# Patient Record
Sex: Male | Born: 1970 | Race: Black or African American | Hispanic: No | Marital: Single | Smoking: Never smoker
Health system: Southern US, Community
[De-identification: ages and names within clinical notes are randomized; demographics above are authoritative.]

## PROBLEM LIST (undated history)

## (undated) DIAGNOSIS — K4090 Unilateral inguinal hernia, without obstruction or gangrene, not specified as recurrent: Secondary | ICD-10-CM

## (undated) DIAGNOSIS — E669 Obesity, unspecified: Secondary | ICD-10-CM

---

## 1999-01-30 ENCOUNTER — Encounter: Payer: Self-pay | Admitting: Emergency Medicine

## 1999-01-30 ENCOUNTER — Emergency Department (HOSPITAL_COMMUNITY): Admission: EM | Admit: 1999-01-30 | Discharge: 1999-01-30 | Payer: Self-pay | Admitting: Emergency Medicine

## 2001-12-23 ENCOUNTER — Emergency Department (HOSPITAL_COMMUNITY): Admission: EM | Admit: 2001-12-23 | Discharge: 2001-12-23 | Payer: Self-pay | Admitting: *Deleted

## 2009-12-10 ENCOUNTER — Emergency Department (HOSPITAL_COMMUNITY): Admission: EM | Admit: 2009-12-10 | Discharge: 2009-12-11 | Payer: Self-pay | Admitting: Emergency Medicine

## 2010-10-21 LAB — GC/CHLAMYDIA PROBE AMP, GENITAL
Chlamydia, DNA Probe: NEGATIVE
GC Probe Amp, Genital: NEGATIVE

## 2016-07-03 DIAGNOSIS — K4090 Unilateral inguinal hernia, without obstruction or gangrene, not specified as recurrent: Secondary | ICD-10-CM

## 2016-07-03 HISTORY — DX: Unilateral inguinal hernia, without obstruction or gangrene, not specified as recurrent: K40.90

## 2016-07-04 ENCOUNTER — Emergency Department (HOSPITAL_COMMUNITY): Payer: Medicaid Other

## 2016-07-04 ENCOUNTER — Encounter (HOSPITAL_COMMUNITY): Payer: Self-pay | Admitting: *Deleted

## 2016-07-04 ENCOUNTER — Emergency Department (HOSPITAL_COMMUNITY)
Admission: EM | Admit: 2016-07-04 | Discharge: 2016-07-04 | Disposition: A | Discharge status: Home / Self Care | Payer: Medicaid Other | Attending: Emergency Medicine | Admitting: Emergency Medicine

## 2016-07-04 DIAGNOSIS — K4031 Unilateral inguinal hernia, with obstruction, without gangrene, recurrent: Secondary | ICD-10-CM | POA: Diagnosis not present

## 2016-07-04 DIAGNOSIS — K56600 Partial intestinal obstruction, unspecified as to cause: Secondary | ICD-10-CM

## 2016-07-04 DIAGNOSIS — R109 Unspecified abdominal pain: Secondary | ICD-10-CM | POA: Diagnosis present

## 2016-07-04 DIAGNOSIS — K4090 Unilateral inguinal hernia, without obstruction or gangrene, not specified as recurrent: Secondary | ICD-10-CM

## 2016-07-04 HISTORY — DX: Obesity, unspecified: E66.9

## 2016-07-04 LAB — COMPREHENSIVE METABOLIC PANEL
ALK PHOS: 97 U/L (ref 38–126)
ALT: 78 U/L — AB (ref 17–63)
AST: 60 U/L — ABNORMAL HIGH (ref 15–41)
Albumin: 4.1 g/dL (ref 3.5–5.0)
Anion gap: 10 (ref 5–15)
BILIRUBIN TOTAL: 1 mg/dL (ref 0.3–1.2)
BUN: 13 mg/dL (ref 6–20)
CALCIUM: 9.3 mg/dL (ref 8.9–10.3)
CO2: 24 mmol/L (ref 22–32)
CREATININE: 1.2 mg/dL (ref 0.61–1.24)
Chloride: 102 mmol/L (ref 101–111)
Glucose, Bld: 96 mg/dL (ref 65–99)
Potassium: 3.6 mmol/L (ref 3.5–5.1)
Sodium: 136 mmol/L (ref 135–145)
Total Protein: 7.9 g/dL (ref 6.5–8.1)

## 2016-07-04 LAB — LIPASE, BLOOD: Lipase: 30 U/L (ref 11–51)

## 2016-07-04 LAB — CBC
HCT: 44.3 % (ref 39.0–52.0)
Hemoglobin: 15.1 g/dL (ref 13.0–17.0)
MCH: 29.6 pg (ref 26.0–34.0)
MCHC: 34.1 g/dL (ref 30.0–36.0)
MCV: 86.9 fL (ref 78.0–100.0)
PLATELETS: 200 10*3/uL (ref 150–400)
RBC: 5.1 MIL/uL (ref 4.22–5.81)
RDW: 13.5 % (ref 11.5–15.5)
WBC: 9.9 10*3/uL (ref 4.0–10.5)

## 2016-07-04 LAB — URINALYSIS, ROUTINE W REFLEX MICROSCOPIC
Glucose, UA: NEGATIVE mg/dL
HGB URINE DIPSTICK: NEGATIVE
Ketones, ur: NEGATIVE mg/dL
LEUKOCYTES UA: NEGATIVE
Nitrite: NEGATIVE
PROTEIN: NEGATIVE mg/dL
Specific Gravity, Urine: 1.028 (ref 1.005–1.030)
pH: 5.5 (ref 5.0–8.0)

## 2016-07-04 MED ORDER — IOPAMIDOL (ISOVUE-300) INJECTION 61%
INTRAVENOUS | Status: AC
  Administered 2016-07-04: 100 mL
  Filled 2016-07-04: qty 100

## 2016-07-04 MED ORDER — SODIUM CHLORIDE 0.9 % IV SOLN
INTRAVENOUS | Status: DC
  Administered 2016-07-04: 19:00:00 via INTRAVENOUS

## 2016-07-04 NOTE — ED Notes (Signed)
EDP at bedside  

## 2016-07-04 NOTE — Discharge Instructions (Signed)
Call [facility] office in 2 days to schedule next available appointment. Ask to see the next available surgeon. Tell office staff that you were seen in the emergency department today. Return to the emergency Department if your pain is not well controlled or if you or unable to hold down fluids without vomiting or unable to pass gas out of your rectum or if concerned for any reason. It is okay to take Tylenol as directed for pain

## 2016-07-04 NOTE — ED Notes (Signed)
Surgeon at bedside.  

## 2016-07-04 NOTE — ED Triage Notes (Signed)
Pt reports possible hernia to right groin for extended amount of time. Now has generalized abd pain with nausea.

## 2016-07-04 NOTE — ED Provider Notes (Addendum)
[facility]-EMERGENCY DEPT Provider Note   CSN: [id number] Arrival date & time: 07/04/16  1256     History   Chief Complaint Chief Complaint  Patient presents with  . Abdominal Pain  . Inguinal Hernia    HPI Travis Gould is a 45 y.o. male.Complains of diffuse abdominal pain onset 2-3 days ago. He vomited one time prior to the onset of the pain. He has not eaten in 2 days though he states he is hungry. His last bowel movement was 3 or 4 days ago, normal. Pain is made worse by certain positions and improved by lying in other positions. Presently pain is minimal. No fever. No chest pain. No shortness of breath. No urinary symptoms. Patient also reports swollen scrotum since 2007 which is unchanged and not painful. He thinks it may be a hernia.  HPI  Past Medical History:  Diagnosis Date  . Obesity     There are no active problems to display for this patient.   History reviewed. No pertinent surgical history.   past medical history negative Past surgical history umbilical hernia repair as child   Home Medications    Prior to Admission medications   Not on File    Family History History reviewed. No pertinent family history.  Social History Social History  Substance Use Topics  . Smoking status: Never Smoker  . Smokeless tobacco: Not on file  . Alcohol use Yes     Comment: occ   Occasional alcohol positive marijuana use no other drug use  Allergies   Patient has no known allergies.   Review of Systems Review of Systems  Constitutional: Negative.   HENT: Negative.   Respiratory: Negative.   Cardiovascular: Negative.   Gastrointestinal: Positive for abdominal pain.  Genitourinary: Positive for scrotal swelling.  Musculoskeletal: Negative.   Skin: Negative.   Neurological: Negative.   Psychiatric/Behavioral: Negative.   All other systems reviewed and are negative.    Physical Exam Updated Vital Signs BP 120/80   Pulse 78   Temp 98.5 F (36.9 C) (Oral)    Resp 16   SpO2 96%   Physical Exam  Constitutional: He is oriented to person, place, and time. He appears well-developed and well-nourished. No distress.  HENT:  Head: Normocephalic and atraumatic.  Eyes: Conjunctivae are normal. Pupils are equal, round, and reactive to light.  Neck: Neck supple. No tracheal deviation present. No thyromegaly present.  Cardiovascular: Normal rate and regular rhythm.   No murmur heard. Pulmonary/Chest: Effort normal and breath sounds normal.  Abdominal: Soft. Bowel sounds are normal. He exhibits no distension. There is no tenderness.  Genitourinary: Penis normal.  Genitourinary Comments: Scrotum grapefruit sized, nontender  Musculoskeletal: Normal range of motion. He exhibits no edema or tenderness.  Neurological: He is alert and oriented to person, place, and time. Coordination normal.  Skin: Skin is warm and dry. No rash noted.  Psychiatric: He has a normal mood and affect.  Nursing note and vitals reviewed.    ED Treatments / Results  Labs (all labs ordered are listed, but only abnormal results are displayed) Labs Reviewed  COMPREHENSIVE METABOLIC PANEL - Abnormal; Notable for the following:       Result Value   AST 60 (*)    ALT 78 (*)    All other components within normal limits  URINALYSIS, ROUTINE W REFLEX MICROSCOPIC (NOT AT [facility]) - Abnormal; Notable for the following:    Color, Urine AMBER (*)    Bilirubin Urine SMALL (*)  All other components within normal limits  LIPASE, BLOOD  CBC    EKG  EKG Interpretation None       Radiology No results found.  Procedures Procedures (including critical care time)  Medications Ordered in ED Medications - No data to display  Results for orders placed or performed during the hospital encounter of 07/04/16  Lipase, blood  Result Value Ref Range   Lipase 30 11 - 51 U/L  Comprehensive metabolic panel  Result Value Ref Range   Sodium 136 135 - 145 mmol/L   Potassium 3.6 3.5 -  5.1 mmol/L   Chloride 102 101 - 111 mmol/L   CO2 24 22 - 32 mmol/L   Glucose, Bld 96 65 - 99 mg/dL   BUN 13 6 - 20 mg/dL   Creatinine, Ser 1.20 0.61 - 1.24 mg/dL   Calcium 9.3 8.9 - 10.3 mg/dL   Total Protein 7.9 6.5 - 8.1 g/dL   Albumin 4.1 3.5 - 5.0 g/dL   AST 60 (H) 15 - 41 U/L   ALT 78 (H) 17 - 63 U/L   Alkaline Phosphatase 97 38 - 126 U/L   Total Bilirubin 1.0 0.3 - 1.2 mg/dL   GFR calc non Af Amer >60 >60 mL/min   GFR calc Af Amer >60 >60 mL/min   Anion gap 10 5 - 15  CBC  Result Value Ref Range   WBC 9.9 4.0 - 10.5 K/uL   RBC 5.10 4.22 - 5.81 MIL/uL   Hemoglobin 15.1 13.0 - 17.0 g/dL   HCT 44.3 39.0 - 52.0 %   MCV 86.9 78.0 - 100.0 fL   MCH 29.6 26.0 - 34.0 pg   MCHC 34.1 30.0 - 36.0 g/dL   RDW 13.5 11.5 - 15.5 %   Platelets 200 150 - 400 K/uL  Urinalysis, Routine w reflex microscopic  Result Value Ref Range   Color, Urine AMBER (A) YELLOW   APPearance CLEAR CLEAR   Specific Gravity, Urine 1.028 1.005 - 1.030   pH 5.5 5.0 - 8.0   Glucose, UA NEGATIVE NEGATIVE mg/dL   Hgb urine dipstick NEGATIVE NEGATIVE   Bilirubin Urine SMALL (A) NEGATIVE   Ketones, ur NEGATIVE NEGATIVE mg/dL   Protein, ur NEGATIVE NEGATIVE mg/dL   Nitrite NEGATIVE NEGATIVE   Leukocytes, UA NEGATIVE NEGATIVE   Ct Abdomen Pelvis W Contrast  Result Date: 07/04/2016 CLINICAL DATA:  Right abdominal and right lower quadrant abdominal pain for the past 3 days, increasing. EXAM: CT ABDOMEN AND PELVIS WITH CONTRAST TECHNIQUE: Multidetector CT imaging of the abdomen and pelvis was performed using the standard protocol following bolus administration of intravenous contrast. CONTRAST:  <MEASUREMLaverTempleGle(36Samson FrederJamison Nei086Danelle EarthlyhBever706 Holl76[facility] <MEASUREMENLaverPowers LGle9Samson FrederJamison Nei086Danelle EarthlBever883 [address] 8t(73[facility]<MEASUREMENLaverKohls RaGle5Samson FrederJamison Nei086Danelle Earthlyhb(Bever14 [place] 57Crous<MEASUREMENLaverForGle6Samson FrederJamison Nei086Danelle EarthlyhBever421 [place] 87[facility] <MEASUREMENLaverCarrGlSamson FrederJamison Nei086Danelle EarthlyhBever9702 Pe40[facility]<MEASUREMENLaverToSamson Fred086Danelle EarthlyBever183 Proc<MEASUREMENLaverBoulder CanGle2Samson FrederJamison Nei086Danelle EarthlyhBever9[address] [place](61[facility]<MEASUREMENLaverRush HGle(5Samson FrederJamison Nei086Danelle EarthlyhBever9317 O(66[facility]<MEASUREMENLaverMount SterlGle3Samson FrederJamison Nei086Danelle EarthlyhBever666 Grant98[facility]<MEASUREMENLaverGrand MarGle9Samson FrederJamison Nei086Danelle EarthlyhBever9 [place]34[facility] <MEASUREMENLaverThe College of New JerGle9Samson FrederJamison Nei086Danelle EarthlyhbBever503 Pendergast 63[facility] <MEASUREMENLaverSwissvGle3Samson FrederJamison Nei086Danelle EarthlBever9792 East [address] Hollo62[facility]<MEASUREMENLaverWingGle4Samson FrederJamison Nei086Danelle EarthlBever94 [address]. Glenridg21[facility]<MEASUREMENLaverNederlGle8Samson FrederJamison Nei086Danelle EarthlyhBever808 [facility]<MEASUREMENLaverNew OxfGle5Samson FrederJamison Nei086Danelle EarthlyhBever8091 Youn97[facility] O<MEASUREMENLaverLa GraGle7Samson FrederJamison Nei086Danelle EarthlyhBever177 Brickyar78[facility] <MEASUREMENLaverWoodlGle3Samson FrederJamison Nei086Danelle EarthlyhBever179 S. Rockvil80Cs<MEASUREMENLaverLittle SturgGle2Samson FrederJamison Nei086Danelle EarthlyhBever454 [address] Amher94[facility]<MEASUREMENLaverLamingGle6Samson FrederJamison Nei086Danelle EarthlyhbBever16 [address] Wes73[facility]<MEASUREMENLaverGrayGle2Samson FrederJamison Nei086Danelle EarthlyhbBever20 Bisho70P[facility]i<MEASUREMENLaverParkerviGle5Samson FrederJamison Nei086Danelle EarthlyhBever76 Edgewate31[facility] [place] [facility]i<MEASUREMENLaverAlpine NortheGle6Samson FrederJamison Nei086Danelle EarthlyhBever59 Libert(73[facility] Surgery CentTennesseesonari ProwsIDOL (ISOVUE-300) INJECTION 61% COMPARISON:  None. FINDINGS: Lower chest: No acute abnormality. Hepatobiliary: Mild diffuse low density of the liver relative to the spleen. Normal appearing gallbladder. Pancreas: Unremarkable. No pancreatic ductal dilatation or surrounding inflammatory changes. Spleen:  Normal in size without focal abnormality. Adrenals/Urinary Tract: Small left renal cysts. Normal appearing right kidney, ureters and urinary bladder. No urinary tract calculi or hydronephrosis. Normal appearing adrenal glands. Stomach/Bowel: Large right inguinal hernia containing a loop of small bowel and fluid. Mild dilatation of the small bowel proximal to the hernia with some rotation of the mesentery with mild mesenteric edema. Normal caliber small bowel distal to the hernia. Unremarkable stomach and colon.  No evidence of appendicitis. Vascular/Lymphatic: No significant vascular findings are present. No enlarged abdominal or pelvic lymph nodes. Reproductive: Normal sized prostate gland containing a small amount of  coarse calcification. Other: Moderate-sized right hydrocele. Musculoskeletal: Oval, corticated lucent lesion in right iliac bone. This measures 1.7 cm in maximum diameter on image number 57 of series 2. Mild lower thoracic spine degenerative changes. IMPRESSION: 1. Large right inguinal hernia containing a herniated small bowel loop causing partial small bowel obstruction, mesenteric rotation and mesenteric edema. 2. Moderate amount of free peritoneal fluid within the large right inguinal hernia, as well as a moderate-sized right hydrocele. 3. 1.7 cm nonaggressive lesion in the right iliac bone. This most likely represents an enchondroma or, possibly, an hemangioma. 4. Mild diffuse hepatic steatosis. Electronically Signed   By: Beckie SaltsSteven  Reid M.D.   On: 07/04/2016 17:52   Initial Impression / Assessment and Plan / ED Course  I have reviewed the triage vital signs and the nursing notes.  Pertinent labs & imaging results that were available during my care of the patient were reviewed by me and considered in my medical decision making (see chart for details).  Clinical Course    7 PM patient asymptomatic, pain-free. Dr. Doylene Canardonner consulted who will come to ED to evaluate patient   Final Clinical  Impressions(s) / ED Diagnoses  Diagnosis right-sided inguinal hernia with partial small bowel obstruction Final diagnoses:  None    New Prescriptions New Prescriptions   No medications on file     Doug SouSam Pat Sires, MD 07/04/16 1904 Addendum Dr. Doylene Canardonner evaluated patient in ED and feels that he can go home if he is able to do oral challenge. Without nausea or vomiting. Patient drinks several ounces of ice water without nausea vomiting or increased pain. He feels ready to go home. He is asymptomatic and pain-free on discharge. He states that he does continue to pass gas per record. Clinically patient does not have bowel obstruction. Plan return to emergency department for worsening symptoms. Call [facility] office in 2 days to schedule office appointment for next week   Doug SouSam Ervan Heber, MD 07/04/16 2046

## 2016-07-04 NOTE — ED Notes (Signed)
Patient transported to CT 

## 2016-07-04 NOTE — Consult Note (Signed)
Surgical Consultation Requesting provider: Dr. Ethelda ChickJacubowitz  CC: abdominal pain  HPI: This is a very pleasant 45yo gentleman who presents to the er with a 2 day history of vague, central abdominal pain and mild nausea. Has not had a BM or flatus that he recalls in 2 days. Has been drinking liquids and keeping them down but not eating much. He has a longstanding history of a large, reducible right inguinal hernia. No prior repair, though he did have an umbilical hernia repair as a child. Currently his pain is somewhat improved. His evaluation in the ER reveals normal vitals, normal labs, and a CT scan which demonstrates the large hernia and suggests a partial obstruction here. He denies pain in the groin or scrotum.   No Known Allergies  Past Medical History:  Diagnosis Date  . Obesity     PSH: Umbilical hernia repair as a child  History reviewed. No pertinent family history.   He works as a Nutritional therapistplumber.  Social History   Social History  . Marital status: Married    Spouse name: N/A  . Number of children: N/A  . Years of education: N/A   Social History Main Topics  . Smoking status: Never Smoker  . Smokeless tobacco: None  . Alcohol use Yes     Comment: occ  . Drug use: Unknown  . Sexual activity: Not Asked   Other Topics Concern  . None   Social History Narrative  . None    No current facility-administered medications on file prior to encounter.    No current outpatient prescriptions on file prior to encounter.    Review of Systems: a complete, 10pt review of systems was completed with pertinent positives and negatives as documented in the HPI.   Physical Exam: Vitals:   07/04/16 1900 07/04/16 1930  BP: 118/92 111/82  Pulse: 81 69  Resp: 16 18  Temp:     Gen: A&Ox3, no distress  Head: normocephalic, atraumatic, EOMI, anicteric.  Neck: supple without mass or thyromegaly Chest: unlabored respirations clear bilaterally  Cardiovascular: RRR with palpable distal  pulses Abdomen: obese, soft, nontender. Not distended. No rebound or guarding. No mass or organomegaly. Well-healed infraumbilical scar, no hernia here. No left inguinal hernia. Large right inguinal hernia. This reduces easily with the patient supine. There is no tenderness here as all.  Extremities: warm, without edema, no deformities  Neuro: grossly intact Psych: appropriate mood and affect  Skin: no lesions or rashes  CBC Latest Ref Rng & Units 07/04/2016  WBC 4.0 - 10.5 K/uL 9.9  Hemoglobin 13.0 - 17.0 g/dL 16.115.1  Hematocrit 09.639.0 - 52.0 % 44.3  Platelets 150 - 400 K/uL 200    CMP Latest Ref Rng & Units 07/04/2016  Glucose 65 - 99 mg/dL 96  BUN 6 - 20 mg/dL 13  Creatinine 0.450.61 - 4.091.24 mg/dL 8.111.20  Sodium 914135 - 782145 mmol/L 136  Potassium 3.5 - 5.1 mmol/L 3.6  Chloride 101 - 111 mmol/L 102  CO2 22 - 32 mmol/L 24  Calcium 8.9 - 10.3 mg/dL 9.3  Total Protein 6.5 - 8.1 g/dL 7.9  Total Bilirubin 0.3 - 1.2 mg/dL 1.0  Alkaline Phos 38 - 126 U/L 97  AST 15 - 41 U/L 60(H)  ALT 17 - 63 U/L 78(H)    No results found for: INR, PROTIME  Imaging: CLINICAL DATA:  Right abdominal and right lower quadrant abdominal pain for the past 3 days, increasing.  EXAM: CT ABDOMEN AND PELVIS WITH CONTRAST  TECHNIQUE: Multidetector CT imaging of the abdomen and pelvis was performed using the standard protocol following bolus administration of intravenous contrast.  CONTRAST:  100mL ISOVUE-300 IOPAMIDOL (ISOVUE-300) INJECTION 61%  COMPARISON:  None.  FINDINGS: Lower chest: No acute abnormality.  Hepatobiliary: Mild diffuse low density of the liver relative to the spleen. Normal appearing gallbladder.  Pancreas: Unremarkable. No pancreatic ductal dilatation or surrounding inflammatory changes.  Spleen: Normal in size without focal abnormality.  Adrenals/Urinary Tract: Small left renal cysts. Normal appearing right kidney, ureters and urinary bladder. No urinary tract calculi or  hydronephrosis. Normal appearing adrenal glands.  Stomach/Bowel: Large right inguinal hernia containing a loop of small bowel and fluid. Mild dilatation of the small bowel proximal to the hernia with some rotation of the mesentery with mild mesenteric edema. Normal caliber small bowel distal to the hernia.  Unremarkable stomach and colon.  No evidence of appendicitis.  Vascular/Lymphatic: No significant vascular findings are present. No enlarged abdominal or pelvic lymph nodes.  Reproductive: Normal sized prostate gland containing a small amount of coarse calcification.  Other: Moderate-sized right hydrocele.  Musculoskeletal: Oval, corticated lucent lesion in right iliac bone. This measures 1.7 cm in maximum diameter on image number 57 of series 2. Mild lower thoracic spine degenerative changes.  IMPRESSION: 1. Large right inguinal hernia containing a herniated small bowel loop causing partial small bowel obstruction, mesenteric rotation and mesenteric edema. 2. Moderate amount of free peritoneal fluid within the large right inguinal hernia, as well as a moderate-sized right hydrocele. 3. 1.7 cm nonaggressive lesion in the right iliac bone. This most likely represents an enchondroma or, possibly, an hemangioma. 4. Mild diffuse hepatic steatosis.   Electronically Signed   By: Beckie SaltsSteven  Reid M.D.   On: 07/04/2016 17:52  A/P: 45yo male with abdominal pain. Not clinically obstructed- has been tolerating liquids; may benefit from suppository. Normal labs/vitals, his abdominal exam is benign and his hernia, while large, is nontender and easily reducible. No indication for emergency surgery. I do recommend that he follow up with us soon to repair his large hernia. Will follow if admitted.    Phylliss Blakeshelsea Connor, MD [facility], [place] Pager [phone]

## 2016-07-06 ENCOUNTER — Encounter (HOSPITAL_COMMUNITY): Payer: Self-pay

## 2016-07-06 DIAGNOSIS — Z8249 Family history of ischemic heart disease and other diseases of the circulatory system: Secondary | ICD-10-CM | POA: Diagnosis not present

## 2016-07-06 DIAGNOSIS — K4090 Unilateral inguinal hernia, without obstruction or gangrene, not specified as recurrent: Secondary | ICD-10-CM | POA: Diagnosis present

## 2016-07-06 DIAGNOSIS — Z6836 Body mass index (BMI) 36.0-36.9, adult: Secondary | ICD-10-CM | POA: Insufficient documentation

## 2016-07-06 DIAGNOSIS — K4030 Unilateral inguinal hernia, with obstruction, without gangrene, not specified as recurrent: Principal | ICD-10-CM | POA: Insufficient documentation

## 2016-07-06 DIAGNOSIS — F17210 Nicotine dependence, cigarettes, uncomplicated: Secondary | ICD-10-CM | POA: Insufficient documentation

## 2016-07-06 NOTE — ED Triage Notes (Signed)
Pt stats he started having groin pain about 1 week ago; Pt states he believes he has a hernia; Pt states hx of hernia as a baby; Pt states pain at 7/10 on right side groin. Pt a&ox 4 on arrival.

## 2016-07-07 ENCOUNTER — Encounter (HOSPITAL_COMMUNITY): Payer: Self-pay | Admitting: Critical Care Medicine

## 2016-07-07 ENCOUNTER — Observation Stay (HOSPITAL_COMMUNITY): Payer: Medicaid Other | Admitting: Critical Care Medicine

## 2016-07-07 ENCOUNTER — Encounter (HOSPITAL_COMMUNITY)
Admission: EM | Disposition: A | Discharge status: Home / Self Care | Payer: Self-pay | Source: Home / Self Care | Attending: Emergency Medicine

## 2016-07-07 ENCOUNTER — Observation Stay (HOSPITAL_COMMUNITY)
Admission: EM | Admit: 2016-07-07 | Discharge: 2016-07-08 | Disposition: A | Discharge status: Home / Self Care | Payer: Medicaid Other | Attending: General Surgery | Admitting: General Surgery

## 2016-07-07 ENCOUNTER — Emergency Department (HOSPITAL_COMMUNITY): Payer: Medicaid Other

## 2016-07-07 DIAGNOSIS — K4030 Unilateral inguinal hernia, with obstruction, without gangrene, not specified as recurrent: Secondary | ICD-10-CM | POA: Diagnosis present

## 2016-07-07 DIAGNOSIS — Z8249 Family history of ischemic heart disease and other diseases of the circulatory system: Secondary | ICD-10-CM | POA: Diagnosis not present

## 2016-07-07 DIAGNOSIS — F17210 Nicotine dependence, cigarettes, uncomplicated: Secondary | ICD-10-CM | POA: Diagnosis not present

## 2016-07-07 DIAGNOSIS — K4090 Unilateral inguinal hernia, without obstruction or gangrene, not specified as recurrent: Secondary | ICD-10-CM

## 2016-07-07 DIAGNOSIS — K56609 Unspecified intestinal obstruction, unspecified as to partial versus complete obstruction: Secondary | ICD-10-CM

## 2016-07-07 HISTORY — PX: INGUINAL HERNIA REPAIR: SHX194

## 2016-07-07 HISTORY — PX: INSERTION OF MESH: SHX5868

## 2016-07-07 HISTORY — DX: Unilateral inguinal hernia, without obstruction or gangrene, not specified as recurrent: K40.90

## 2016-07-07 LAB — COMPREHENSIVE METABOLIC PANEL
ALBUMIN: 4.3 g/dL (ref 3.5–5.0)
ALK PHOS: 90 U/L (ref 38–126)
ALT: 70 U/L — AB (ref 17–63)
ANION GAP: 11 (ref 5–15)
AST: 34 U/L (ref 15–41)
BUN: 15 mg/dL (ref 6–20)
CALCIUM: 9.7 mg/dL (ref 8.9–10.3)
CO2: 27 mmol/L (ref 22–32)
CREATININE: 1.29 mg/dL — AB (ref 0.61–1.24)
Chloride: 98 mmol/L — ABNORMAL LOW (ref 101–111)
GFR calc Af Amer: >60 mL/min (ref >60)
GFR calc non Af Amer: >60 mL/min (ref >60)
GLUCOSE: 102 mg/dL — AB (ref 65–99)
Potassium: 3.6 mmol/L (ref 3.5–5.1)
SODIUM: 136 mmol/L (ref 135–145)
Total Bilirubin: 1.1 mg/dL (ref 0.3–1.2)
Total Protein: 8.1 g/dL (ref 6.5–8.1)

## 2016-07-07 LAB — CBC WITH DIFFERENTIAL/PLATELET
BASOS ABS: 0 10*3/uL (ref 0.0–0.1)
BASOS PCT: 0 %
EOS ABS: 0.1 10*3/uL (ref 0.0–0.7)
Eosinophils Relative: 1 %
HCT: 45.5 % (ref 39.0–52.0)
HEMOGLOBIN: 15.5 g/dL (ref 13.0–17.0)
Lymphocytes Relative: 32 %
Lymphs Abs: 2.9 10*3/uL (ref 0.7–4.0)
MCH: 29.3 pg (ref 26.0–34.0)
MCHC: 34.1 g/dL (ref 30.0–36.0)
MCV: 86 fL (ref 78.0–100.0)
Monocytes Absolute: 1.4 10*3/uL — ABNORMAL HIGH (ref 0.1–1.0)
Monocytes Relative: 15 %
NEUTROS ABS: 4.7 10*3/uL (ref 1.7–7.7)
NEUTROS PCT: 52 %
Platelets: 174 10*3/uL (ref 150–400)
RBC: 5.29 MIL/uL (ref 4.22–5.81)
RDW: 13.3 % (ref 11.5–15.5)
WBC: 9.1 10*3/uL (ref 4.0–10.5)

## 2016-07-07 LAB — URINALYSIS, ROUTINE W REFLEX MICROSCOPIC
Glucose, UA: NEGATIVE mg/dL
Ketones, ur: 15 mg/dL — AB
Leukocytes, UA: NEGATIVE
NITRITE: NEGATIVE
PH: 5.5 (ref 5.0–8.0)
Protein, ur: NEGATIVE mg/dL
SPECIFIC GRAVITY, URINE: 1.036 — AB (ref 1.005–1.030)

## 2016-07-07 LAB — URINE MICROSCOPIC-ADD ON

## 2016-07-07 LAB — LIPASE, BLOOD: Lipase: 26 U/L (ref 11–51)

## 2016-07-07 SURGERY — REPAIR, HERNIA, INGUINAL, INCARCERATED
Anesthesia: General | Site: Groin | Laterality: Right

## 2016-07-07 MED ORDER — MIDAZOLAM HCL 5 MG/5ML IJ SOLN
INTRAMUSCULAR | Status: DC | PRN
  Administered 2016-07-07: 2 mg via INTRAVENOUS

## 2016-07-07 MED ORDER — DIPHENHYDRAMINE HCL 12.5 MG/5ML PO ELIX: 12.5000 mg | ORAL_SOLUTION | Freq: Four times a day (QID) | ORAL | Status: DC | PRN

## 2016-07-07 MED ORDER — METHOCARBAMOL 500 MG PO TABS
500.0000 mg | ORAL_TABLET | Freq: Four times a day (QID) | ORAL | Status: DC | PRN
  Administered 2016-07-07: 500 mg via ORAL
  Filled 2016-07-07: qty 1

## 2016-07-07 MED ORDER — PROMETHAZINE HCL 25 MG/ML IJ SOLN: 6.2500 mg | INTRAMUSCULAR | Status: DC | PRN

## 2016-07-07 MED ORDER — MORPHINE SULFATE (PF) 4 MG/ML IV SOLN
6.0000 mg | Freq: Once | INTRAVENOUS | Status: AC
  Administered 2016-07-07: 6 mg via INTRAVENOUS
  Filled 2016-07-07: qty 2

## 2016-07-07 MED ORDER — DEXAMETHASONE SODIUM PHOSPHATE 10 MG/ML IJ SOLN
INTRAMUSCULAR | Status: DC | PRN
  Administered 2016-07-07: 10 mg via INTRAVENOUS

## 2016-07-07 MED ORDER — PROPOFOL 10 MG/ML IV BOLUS
INTRAVENOUS | Status: DC | PRN
  Administered 2016-07-07: 200 mg via INTRAVENOUS

## 2016-07-07 MED ORDER — HYDROMORPHONE HCL 2 MG/ML IJ SOLN
0.2500 mg | INTRAMUSCULAR | Status: DC | PRN
Start: 2016-07-07 — End: 2016-07-07

## 2016-07-07 MED ORDER — MEPERIDINE HCL 25 MG/ML IJ SOLN: 6.2500 mg | INTRAMUSCULAR | Status: DC | PRN

## 2016-07-07 MED ORDER — ROCURONIUM BROMIDE 10 MG/ML (PF) SYRINGE
PREFILLED_SYRINGE | INTRAVENOUS | Status: AC
  Filled 2016-07-07: qty 10

## 2016-07-07 MED ORDER — FENTANYL CITRATE (PF) 100 MCG/2ML IJ SOLN
INTRAMUSCULAR | Status: AC
  Filled 2016-07-07: qty 2

## 2016-07-07 MED ORDER — OXYCODONE HCL 5 MG PO TABS
5.0000 mg | ORAL_TABLET | ORAL | Status: DC | PRN
  Administered 2016-07-07: 10 mg via ORAL
  Filled 2016-07-07: qty 2

## 2016-07-07 MED ORDER — DEXAMETHASONE SODIUM PHOSPHATE 10 MG/ML IJ SOLN
INTRAMUSCULAR | Status: AC
  Filled 2016-07-07: qty 1

## 2016-07-07 MED ORDER — MIDAZOLAM HCL 2 MG/2ML IJ SOLN
INTRAMUSCULAR | Status: AC
  Filled 2016-07-07: qty 2

## 2016-07-07 MED ORDER — LIDOCAINE 2% (20 MG/ML) 5 ML SYRINGE
INTRAMUSCULAR | Status: DC | PRN
  Administered 2016-07-07: 20 mg via INTRAVENOUS

## 2016-07-07 MED ORDER — LIDOCAINE 2% (20 MG/ML) 5 ML SYRINGE
INTRAMUSCULAR | Status: AC
  Filled 2016-07-07: qty 5

## 2016-07-07 MED ORDER — FENTANYL CITRATE (PF) 100 MCG/2ML IJ SOLN
INTRAMUSCULAR | Status: AC
  Filled 2016-07-07: qty 4

## 2016-07-07 MED ORDER — ONDANSETRON HCL 4 MG/2ML IJ SOLN
4.0000 mg | Freq: Once | INTRAMUSCULAR | Status: AC
  Administered 2016-07-07: 4 mg via INTRAVENOUS
  Filled 2016-07-07: qty 2

## 2016-07-07 MED ORDER — BUPIVACAINE-EPINEPHRINE 0.25% -1:200000 IJ SOLN
INTRAMUSCULAR | Status: DC | PRN
  Administered 2016-07-07: 20 mL

## 2016-07-07 MED ORDER — SUGAMMADEX SODIUM 500 MG/5ML IV SOLN
INTRAVENOUS | Status: DC | PRN
  Administered 2016-07-07: 250 mg via INTRAVENOUS

## 2016-07-07 MED ORDER — ONDANSETRON HCL 4 MG/2ML IJ SOLN
INTRAMUSCULAR | Status: AC
  Filled 2016-07-07: qty 4

## 2016-07-07 MED ORDER — 0.9 % SODIUM CHLORIDE (POUR BTL) OPTIME
TOPICAL | Status: DC | PRN
  Administered 2016-07-07: 1000 mL

## 2016-07-07 MED ORDER — ACETAMINOPHEN 325 MG PO TABS: 650.0000 mg | ORAL_TABLET | Freq: Four times a day (QID) | ORAL | Status: DC | PRN

## 2016-07-07 MED ORDER — KCL IN DEXTROSE-NACL 20-5-0.45 MEQ/L-%-% IV SOLN
INTRAVENOUS | Status: DC
  Administered 2016-07-07 (×2): via INTRAVENOUS
  Filled 2016-07-07 (×3): qty 1000

## 2016-07-07 MED ORDER — MIDAZOLAM HCL 2 MG/2ML IJ SOLN: 0.5000 mg | Freq: Once | INTRAMUSCULAR | Status: DC | PRN

## 2016-07-07 MED ORDER — LACTATED RINGERS IV SOLN
INTRAVENOUS | Status: DC | PRN
  Administered 2016-07-07: 11:00:00 via INTRAVENOUS

## 2016-07-07 MED ORDER — DOCUSATE SODIUM 100 MG PO CAPS
100.0000 mg | ORAL_CAPSULE | Freq: Two times a day (BID) | ORAL | Status: DC
  Administered 2016-07-07 – 2016-07-08 (×2): 100 mg via ORAL
  Filled 2016-07-07 (×2): qty 1

## 2016-07-07 MED ORDER — CEFAZOLIN SODIUM-DEXTROSE 2-4 GM/100ML-% IV SOLN
2.0000 g | INTRAVENOUS | Status: AC
  Administered 2016-07-07: 2 g via INTRAVENOUS
  Filled 2016-07-07: qty 100

## 2016-07-07 MED ORDER — IOPAMIDOL (ISOVUE-300) INJECTION 61%
INTRAVENOUS | Status: AC
  Administered 2016-07-07: 100 mL via INTRAVENOUS
  Filled 2016-07-07: qty 100

## 2016-07-07 MED ORDER — ACETAMINOPHEN 650 MG RE SUPP: 650.0000 mg | Freq: Four times a day (QID) | RECTAL | Status: DC | PRN

## 2016-07-07 MED ORDER — BUPIVACAINE-EPINEPHRINE (PF) 0.25% -1:200000 IJ SOLN
INTRAMUSCULAR | Status: AC
  Filled 2016-07-07: qty 30

## 2016-07-07 MED ORDER — ONDANSETRON HCL 4 MG/2ML IJ SOLN: 4.0000 mg | Freq: Four times a day (QID) | INTRAMUSCULAR | Status: DC | PRN

## 2016-07-07 MED ORDER — SODIUM CHLORIDE 0.9 % IV BOLUS (SEPSIS)
1000.0000 mL | Freq: Once | INTRAVENOUS | Status: AC
  Administered 2016-07-07: 1000 mL via INTRAVENOUS

## 2016-07-07 MED ORDER — ROCURONIUM BROMIDE 10 MG/ML (PF) SYRINGE
PREFILLED_SYRINGE | INTRAVENOUS | Status: DC | PRN
  Administered 2016-07-07 (×2): 10 mg via INTRAVENOUS
  Administered 2016-07-07: 50 mg via INTRAVENOUS

## 2016-07-07 MED ORDER — ONDANSETRON HCL 4 MG/2ML IJ SOLN
INTRAMUSCULAR | Status: DC | PRN
  Administered 2016-07-07: 4 mg via INTRAVENOUS

## 2016-07-07 MED ORDER — DIPHENHYDRAMINE HCL 50 MG/ML IJ SOLN: 12.5000 mg | Freq: Four times a day (QID) | INTRAMUSCULAR | Status: DC | PRN

## 2016-07-07 MED ORDER — ONDANSETRON 4 MG PO TBDP: 4.0000 mg | ORAL_TABLET | Freq: Four times a day (QID) | ORAL | Status: DC | PRN

## 2016-07-07 MED ORDER — HYDROMORPHONE HCL 1 MG/ML IJ SOLN: 0.2500 mg | INTRAMUSCULAR | Status: DC | PRN

## 2016-07-07 MED ORDER — HYDROMORPHONE HCL 2 MG/ML IJ SOLN
0.5000 mg | INTRAMUSCULAR | Status: DC | PRN
  Administered 2016-07-07: 1 mg via INTRAVENOUS
  Filled 2016-07-07: qty 1

## 2016-07-07 MED ORDER — FENTANYL CITRATE (PF) 100 MCG/2ML IJ SOLN
INTRAMUSCULAR | Status: DC | PRN
  Administered 2016-07-07: 100 ug via INTRAVENOUS
  Administered 2016-07-07: 50 ug via INTRAVENOUS
  Administered 2016-07-07: 100 ug via INTRAVENOUS

## 2016-07-07 MED ORDER — POLYETHYLENE GLYCOL 3350 17 G PO PACK: 17.0000 g | PACK | Freq: Every day | ORAL | Status: DC | PRN

## 2016-07-07 MED ORDER — SUGAMMADEX SODIUM 500 MG/5ML IV SOLN
INTRAVENOUS | Status: AC
  Filled 2016-07-07: qty 5

## 2016-07-07 SURGICAL SUPPLY — 53 items
ADH SKN CLS APL DERMABOND .7 (GAUZE/BANDAGES/DRESSINGS) ×2
BLADE SURG ROTATE 9660 (MISCELLANEOUS) ×2 IMPLANT
CANISTER SUCTION 2500CC (MISCELLANEOUS) ×2 IMPLANT
CHLORAPREP W/TINT 26ML (MISCELLANEOUS) ×4 IMPLANT
COVER SURGICAL LIGHT HANDLE (MISCELLANEOUS) ×4 IMPLANT
DECANTER SPIKE VIAL GLASS SM (MISCELLANEOUS) ×4 IMPLANT
DERMABOND ADVANCED (GAUZE/BANDAGES/DRESSINGS) ×2
DERMABOND ADVANCED .7 DNX12 (GAUZE/BANDAGES/DRESSINGS) ×2 IMPLANT
DRAIN PENROSE 1/2X12 LTX STRL (WOUND CARE) ×2 IMPLANT
DRAPE LAPAROSCOPIC ABDOMINAL (DRAPES) ×4 IMPLANT
DRAPE UTILITY XL STRL (DRAPES) ×4 IMPLANT
ELECT CAUTERY BLADE 6.4 (BLADE) ×4 IMPLANT
ELECT REM PT RETURN 9FT ADLT (ELECTROSURGICAL) ×4
ELECTRODE REM PT RTRN 9FT ADLT (ELECTROSURGICAL) ×2 IMPLANT
GLOVE BIO SURGEON STRL SZ8 (GLOVE) ×4 IMPLANT
GLOVE BIOGEL PI IND STRL 7.0 (GLOVE) IMPLANT
GLOVE BIOGEL PI IND STRL 8 (GLOVE) ×2 IMPLANT
GLOVE BIOGEL PI INDICATOR 7.0 (GLOVE) ×2
GLOVE BIOGEL PI INDICATOR 8 (GLOVE) ×2
GLOVE SURG SS PI 7.0 STRL IVOR (GLOVE) ×2 IMPLANT
GOWN STRL REUS W/ TWL LRG LVL3 (GOWN DISPOSABLE) ×2 IMPLANT
GOWN STRL REUS W/ TWL XL LVL3 (GOWN DISPOSABLE) ×2 IMPLANT
GOWN STRL REUS W/TWL LRG LVL3 (GOWN DISPOSABLE) ×4
GOWN STRL REUS W/TWL XL LVL3 (GOWN DISPOSABLE) ×4
KIT BASIN OR (CUSTOM PROCEDURE TRAY) ×4 IMPLANT
KIT ROOM TURNOVER OR (KITS) ×4 IMPLANT
MESH HERNIA 3X6 (Mesh General) ×2 IMPLANT
NEEDLE 22X1 1/2 (OR ONLY) (NEEDLE) ×4 IMPLANT
NS IRRIG 1000ML POUR BTL (IV SOLUTION) ×4 IMPLANT
PACK GENERAL/GYN (CUSTOM PROCEDURE TRAY) ×2 IMPLANT
PACK SURGICAL SETUP 50X90 (CUSTOM PROCEDURE TRAY) ×2 IMPLANT
PAD ARMBOARD 7.5X6 YLW CONV (MISCELLANEOUS) ×4 IMPLANT
PENCIL BUTTON HOLSTER BLD 10FT (ELECTRODE) ×2 IMPLANT
PLUG ATRIUM AND PATCH X LRG (MISCELLANEOUS) IMPLANT
SPECIMEN JAR SMALL (MISCELLANEOUS) IMPLANT
SPONGE LAP 18X18 X RAY DECT (DISPOSABLE) ×2 IMPLANT
SUT MNCRL AB 4-0 PS2 18 (SUTURE) ×4 IMPLANT
SUT PROLENE 2 0 CT2 30 (SUTURE) ×16 IMPLANT
SUT VIC AB 2-0 CT1 27 (SUTURE) ×4
SUT VIC AB 2-0 CT1 TAPERPNT 27 (SUTURE) IMPLANT
SUT VIC AB 2-0 SH 27 (SUTURE) ×4
SUT VIC AB 2-0 SH 27X BRD (SUTURE) ×2 IMPLANT
SUT VIC AB 3-0 SH 27 (SUTURE) ×4
SUT VIC AB 3-0 SH 27X BRD (SUTURE) ×2 IMPLANT
SUT VICRYL AB 3 0 TIES (SUTURE) IMPLANT
SYR BULB 3OZ (MISCELLANEOUS) ×2 IMPLANT
SYR CONTROL 10ML LL (SYRINGE) ×4 IMPLANT
TOWEL OR 17X24 6PK STRL BLUE (TOWEL DISPOSABLE) ×2 IMPLANT
TOWEL OR 17X26 10 PK STRL BLUE (TOWEL DISPOSABLE) ×4 IMPLANT
TUBE CONNECTING 12'X1/4 (SUCTIONS)
TUBE CONNECTING 12X1/4 (SUCTIONS) IMPLANT
WATER STERILE IRR 1000ML POUR (IV SOLUTION) IMPLANT
YANKAUER SUCT BULB TIP NO VENT (SUCTIONS) IMPLANT

## 2016-07-07 NOTE — H&P (Signed)
Travis Gould is an 45 y.o. male.   Chief Complaint: Incarcerated right inguinal hernia HPI:  Patient is a 45 year old male who presents with a right inguinal hernia that will not reduce. He has had hernia for multiple years, but states he has always been able to push it back in. Around 5 days ago it popped out and he had difficulty pushing it back in. He was seen in the ER 2-3 days ago and the hernia was able to be reduced. It popped back out within 24 hours. Yesterday he had some nausea and vomiting and decided to come back to the emergency department. He states the hernia is painful but he does not have any abdominal pain. He only had one episode of nausea and vomiting. He denies fevers and chills.  He had not had any hernia repair before.    Past Medical History:  Diagnosis Date  . Obesity     History reviewed. No pertinent surgical history.  No family history on file. Social History:  reports that he has never smoked. He does not have any smokeless tobacco history on file. He reports that he drinks alcohol. He reports that he uses drugs, including Marijuana.  Allergies: No Known Allergies  Medications. None   Results for orders placed or performed during the hospital encounter of 07/07/16 (from the past 48 hour(s))  CBC with Differential/Platelet     Status: Abnormal   Collection Time: 07/07/16  3:21 AM  Result Value Ref Range   WBC 9.1 4.0 - 10.5 K/uL   RBC 5.29 4.22 - 5.81 MIL/uL   Hemoglobin 15.5 13.0 - 17.0 g/dL   HCT 45.5 39.0 - 52.0 %   MCV 86.0 78.0 - 100.0 fL   MCH 29.3 26.0 - 34.0 pg   MCHC 34.1 30.0 - 36.0 g/dL   RDW 13.3 11.5 - 15.5 %   Platelets 174 150 - 400 K/uL   Neutrophils Relative % 52 %   Neutro Abs 4.7 1.7 - 7.7 K/uL   Lymphocytes Relative 32 %   Lymphs Abs 2.9 0.7 - 4.0 K/uL   Monocytes Relative 15 %   Monocytes Absolute 1.4 (H) 0.1 - 1.0 K/uL   Eosinophils Relative 1 %   Eosinophils Absolute 0.1 0.0 - 0.7 K/uL   Basophils Relative 0 %   Basophils  Absolute 0.0 0.0 - 0.1 K/uL  Comprehensive metabolic panel     Status: Abnormal   Collection Time: 07/07/16  3:21 AM  Result Value Ref Range   Sodium 136 135 - 145 mmol/L   Potassium 3.6 3.5 - 5.1 mmol/L   Chloride 98 (L) 101 - 111 mmol/L   CO2 27 22 - 32 mmol/L   Glucose, Bld 102 (H) 65 - 99 mg/dL   BUN 15 6 - 20 mg/dL   Creatinine, Ser 1.29 (H) 0.61 - 1.24 mg/dL   Calcium 9.7 8.9 - 10.3 mg/dL   Total Protein 8.1 6.5 - 8.1 g/dL   Albumin 4.3 3.5 - 5.0 g/dL   AST 34 15 - 41 U/L   ALT 70 (H) 17 - 63 U/L   Alkaline Phosphatase 90 38 - 126 U/L   Total Bilirubin 1.1 0.3 - 1.2 mg/dL   GFR calc non Af Amer >60 >60 mL/min   GFR calc Af Amer >60 >60 mL/min    Comment: (NOTE) The eGFR has been calculated using the CKD EPI equation. This calculation has not been validated in all clinical situations. eGFR's persistently <60 mL/min signify possible  Chronic Kidney Disease.    Anion gap 11 5 - 15  Lipase, blood     Status: None   Collection Time: 07/07/16  3:21 AM  Result Value Ref Range   Lipase 26 11 - 51 U/L  Urinalysis, Routine w reflex microscopic (not at [facility])     Status: Abnormal   Collection Time: 07/07/16  3:22 AM  Result Value Ref Range   Color, Urine AMBER (A) YELLOW    Comment: BIOCHEMICALS MAY BE AFFECTED BY COLOR   APPearance CLOUDY (A) CLEAR   Specific Gravity, Urine 1.036 (H) 1.005 - 1.030   pH 5.5 5.0 - 8.0   Glucose, UA NEGATIVE NEGATIVE mg/dL   Hgb urine dipstick MODERATE (A) NEGATIVE   Bilirubin Urine MODERATE (A) NEGATIVE   Ketones, ur 15 (A) NEGATIVE mg/dL   Protein, ur NEGATIVE NEGATIVE mg/dL   Nitrite NEGATIVE NEGATIVE   Leukocytes, UA NEGATIVE NEGATIVE  Urine microscopic-add on     Status: Abnormal   Collection Time: 07/07/16  3:22 AM  Result Value Ref Range   Squamous Epithelial / LPF 0-5 (A) NONE SEEN   WBC, UA 0-5 0 - 5 WBC/hpf   RBC / HPF 6-30 0 - 5 RBC/hpf   Bacteria, UA RARE (A) NONE SEEN   Casts HYALINE CASTS (A) NEGATIVE   Urine-Other MUCOUS  PRESENT    Ct Abdomen Pelvis W Contrast  Result Date: 07/07/2016 CLINICAL DATA:  Worsening pain over the past several days. EXAM: CT ABDOMEN AND PELVIS WITH CONTRAST TECHNIQUE: Multidetector CT imaging of the abdomen and pelvis was performed using the standard protocol following bolus administration of intravenous contrast. CONTRAST:  165m ISOVUE-300 IOPAMIDOL (ISOVUE-300) INJECTION 61% COMPARISON:  07/04/2016 FINDINGS: Lower chest: No acute abnormality. Hepatobiliary: No focal liver abnormality is seen. No gallstones, gallbladder wall thickening, or biliary dilatation. Pancreas: Unremarkable. No pancreatic ductal dilatation or surrounding inflammatory changes. Spleen: Normal in size without focal abnormality. Adrenals/Urinary Tract: Adrenal glands are unremarkable. Kidneys are normal, without renal calculi, focal lesion, or hydronephrosis. Bladder is unremarkable. Stomach/Bowel: There is worsened dilatation of small bowel with abrupt transition to decompressed distal ileum at the entrance to the right inguinal hernia. The hernia contains terminal ileum, cecum, and appendix. The remainder of the colon is remarkable only for uncomplicated diverticulosis. No extraluminal air. Vascular/Lymphatic: No significant vascular findings are present. No enlarged abdominal or pelvic lymph nodes. Reproductive: Unremarkable prostate. Other: No ascites. Musculoskeletal: No significant skeletal lesion. Benign bone lesion of the right iliac crest, as previously reported. IMPRESSION: Small bowel obstruction within the right inguinal hernia. The degree of dilatation and the caliber transition have worsened from 07/04/2016. Electronically Signed   By: DAndreas NewportM.D.   On: 07/07/2016 05:22    Review of Systems  Constitutional: Negative.   HENT: Negative.   Eyes: Negative.   Respiratory: Negative.   Cardiovascular: Negative.   Gastrointestinal: Positive for nausea and vomiting.  Genitourinary:       Right groin  pain.    Musculoskeletal: Negative.   Skin: Negative.   Neurological: Negative.   Endo/Heme/Allergies: Negative.   Psychiatric/Behavioral: Negative.     Blood pressure 111/81, pulse 86, temperature 98 F (36.7 C), temperature source Oral, resp. rate 18, height 5' 8"  (1.727 m), weight 108.9 kg (240 lb), SpO2 97 %. Physical Exam  Constitutional: He is oriented to person, place, and time. He appears well-developed and well-nourished. No distress.  HENT:  Head: Normocephalic and atraumatic.  Right Ear: External ear normal.  Left Ear: External  ear normal.  Mouth/Throat: Oropharynx is clear and moist.  Eyes: Conjunctivae are normal. Pupils are equal, round, and reactive to light. No scleral icterus.  Neck: Neck supple. No tracheal deviation present. No thyromegaly present.  Cardiovascular: Normal rate and intact distal pulses.   Respiratory: Effort normal. No respiratory distress.  GI: Soft. He exhibits no distension and no mass. There is no tenderness. There is no rebound and no guarding. A hernia is present. Hernia confirmed positive in the right inguinal area.  Genitourinary:  Genitourinary Comments: Incarcerated right inguinal hernia.  Does not extend into scrotum.  Musculoskeletal: Normal range of motion. He exhibits no edema, tenderness or deformity.  Neurological: He is alert and oriented to person, place, and time. Coordination normal.  Skin: Skin is warm and dry. No rash noted. He is not diaphoretic. No erythema. No pallor.  Psychiatric: He has a normal mood and affect. His behavior is normal. Judgment and thought content normal.     Assessment/Plan Incarcerated Right inguinal hernia with SBO  IV fluids NPO To OR for [facility] repair with Dr. Grandville Silos. Reviewed surgical plan with patient.  Discussed possibility of needing abdominal exploration if bowel compromised.     Stark Klein, MD 07/07/2016, 7:21 AM

## 2016-07-07 NOTE — Anesthesia Postprocedure Evaluation (Signed)
Anesthesia Post Note  Patient: Travis Gould  Procedure(s) Performed: Procedure(s) (LRB): HERNIA REPAIR INGUINAL INCARCERATED (Right) INSERTION OF MESH (Right)  Patient location during evaluation: PACU Anesthesia Type: General Level of consciousness: sedated, oriented and patient cooperative Pain management: pain level controlled Vital Signs Assessment: post-procedure vital signs reviewed and stable Respiratory status: spontaneous breathing, nonlabored ventilation and respiratory function stable Cardiovascular status: blood pressure returned to baseline and stable Postop Assessment: no signs of nausea or vomiting Anesthetic complications: no    Last Vitals:  Vitals:   07/07/16 1053 07/07/16 1418  BP: 116/67 127/76  Pulse: 84 94  Resp: 18 12  Temp: 36.8 C 36.6 C    Last Pain:  Vitals:   07/07/16 1418  TempSrc:   PainSc: Asleep                 Olivea Sonnen,E. Yazlyn Wentzel

## 2016-07-07 NOTE — ED Notes (Signed)
[facility] OR ready, call report to [phone], take pt to short stay bay 38.

## 2016-07-07 NOTE — Anesthesia Preprocedure Evaluation (Addendum)
Anesthesia Evaluation  Patient identified by MRN, date of birth, ID band Patient awake    Reviewed: Allergy & Precautions, NPO status , Patient's Chart, lab work & pertinent test results  History of Anesthesia Complications Negative for: history of anesthetic complications  Airway Mallampati: II  TM Distance: >3 FB Neck ROM: Full    Dental  (+) Poor Dentition, Missing, Loose, Dental Advisory Given, Chipped   Pulmonary Current Smoker,    breath sounds clear to auscultation       Cardiovascular negative cardio ROS   Rhythm:Regular Rate:Normal     Neuro/Psych negative neurological ROS     GI/Hepatic Neg liver ROS, Incarcerated inguinal hernia   Endo/Other  Morbid obesity  Renal/GU negative Renal ROS     Musculoskeletal   Abdominal (+) + obese,   Peds  Hematology negative hematology ROS (+)   Anesthesia Other Findings   Reproductive/Obstetrics                            Anesthesia Physical Anesthesia Plan  ASA: II  Anesthesia Plan: General   Post-op Pain Management:    Induction: Intravenous  Airway Management Planned: Oral ETT  Additional Equipment:   Intra-op Plan:   Post-operative Plan: Extubation in OR  Informed Consent: I have reviewed the patients History and Physical, chart, labs and discussed the procedure including the risks, benefits and alternatives for the proposed anesthesia with the patient or authorized representative who has indicated his/her understanding and acceptance.   Dental advisory given  Plan Discussed with: CRNA and Surgeon  Anesthesia Plan Comments: (Plan routine monitors, GETA)        Anesthesia Quick Evaluation

## 2016-07-07 NOTE — Transfer of Care (Signed)
Immediate Anesthesia Transfer of Care Note  Patient: Travis Gould  Procedure(s) Performed: Procedure(s): HERNIA REPAIR INGUINAL INCARCERATED (Right) INSERTION OF MESH (Right)  Patient Location: PACU  Anesthesia Type:General  Level of Consciousness: awake, alert  and oriented  Airway & Oxygen Therapy: Patient Spontanous Breathing and Patient connected to face mask oxygen  Post-op Assessment: Report given to RN, Post -op Vital signs reviewed and stable and Patient moving all extremities X 4  Post vital signs: Reviewed and stable  Last Vitals:  Vitals:   07/07/16 1053 07/07/16 1418  BP: 116/67   Pulse: 84   Resp: 18   Temp: 36.8 C (P) 36.6 C   HR 93, RR 15, BP 127/76, sats 98% on 6L FM  Last Pain:  Vitals:   07/07/16 1102  TempSrc:   PainSc: 0-No pain         Complications: No apparent anesthesia complications

## 2016-07-07 NOTE — Anesthesia Procedure Notes (Signed)
Procedure Name: Intubation Date/Time: 07/07/2016 12:08 PM Performed by: Glo HerringLEE, Makiyla Linch B Pre-anesthesia Checklist: Patient identified, Emergency Drugs available, Patient being monitored, Suction available and Timeout performed Patient Re-evaluated:Patient Re-evaluated prior to inductionOxygen Delivery Method: Circle system utilized Preoxygenation: Pre-oxygenation with 100% oxygen Intubation Type: IV induction Ventilation: Mask ventilation without difficulty Laryngoscope Size: Mac and 4 Grade View: Grade I Tube type: Oral Number of attempts: 1 Airway Equipment and Method: Stylet Placement Confirmation: ETT inserted through vocal cords under direct vision,  positive ETCO2,  CO2 detector and breath sounds checked- equal and bilateral Secured at: 22 cm Tube secured with: Tape Dental Injury: Teeth and Oropharynx as per pre-operative assessment

## 2016-07-07 NOTE — ED Notes (Signed)
Dr Herby Abrahamhompso n, surgeon, at bedside.

## 2016-07-07 NOTE — Op Note (Signed)
07/07/2016  1:59 PM  PATIENT:  Travis Gould  [age] y.o. male  PRE-OPERATIVE DIAGNOSIS:  INCARCERATED RIGHT INGUINAL HERNIA  POST-OPERATIVE DIAGNOSIS:  INCARCERATED RIGHT INGUINAL HERNIA  PROCEDURE:  Procedure(s): HERNIA REPAIR RIGHT INGUINAL INCARCERATED INSERTION OF MESH  SURGEON:  Surgeon(s): Violeta GelinasBurke Arma Reining, MD  ASSISTANTS: none   ANESTHESIA:   local and general  EBL:  Total I/O In: 1700 [I.V.:700; IV Piggyback:1000] Out: 550 [Urine:500; Blood:50]  BLOOD ADMINISTERED:none  DRAINS: none   SPECIMEN:  No Specimen  DISPOSITION OF SPECIMEN:  N/A  COUNTS:  YES  DICTATION: .Dragon Dictation Findings: Cecum spontaneously reduced. Small bowel within the hernia was easily reduced. It was completely viable.  Procedure in detail: Informed consent was obtained. He received intravenous antibiotics. He was brought to the operating room and general endotracheal anesthesia was administered by the anesthesia staff. His lower abdomen bilateral groins and scrotum were all prepped and draped in a sterile fashion. Timeout procedure was performed. Right inguinal incision was made. Subcutaneous tissues were dissected down through Scarpa's fascia revealing the bulge from the hernia. The external oblique was completely attenuated by the hernia. The little that was left out laterally was divided. The inferior leaflet was dissected down revealing the shelving edge of inguinal ligament. The superior leaflet was dissected off the transversalis. Next, the cord structures with the hernia were encircled with a Penrose drain. There was a very large indirect hernia sac which was dissected free from the cord structures gradually. Contents had mostly spontaneously reduced. Hernia sac was opened and contained a little bit of fluid and small bowel. The small bowel was completely viable and easily reduced back into the abdomen. Due to the large size of it, the hernia sac was excised. Cautery was used for good  hemostasis. The hernia sac was then high ligated with 2 concentric pursestring 2-0 Vicryl sutures. Hernia repair was completed with a keyhole polypropylene mesh. I made this considerably larger than usual. The medial portion of the mesh was tacked down to the tissues over the pubic tubercle with 2-0 Prolene. 2 point was used in a running fashion to secure the mesh to the shelving edge of inguinal ligament inferiorly. Next, the mesh was tacked superiorly again to the tissues over the pubic tubercle with interrupted 2-0 Prolene. There was tacked down to the transversalis with multiple interrupted 2-0 Prolene. The 2 leaflets of the mesh were rejoined behind the cord structures and tacked down to the underlying tissues. Aperture in the mesh was adjusted to just admit the tip of the fifth digit. Cord structures remained viable. Testicle was adjusted to anatomic position in the scrotum. Area was copiously irrigated. Hemostasis was ensured. It was impossible to bring the external oblique back together. Deep tissues were closed with running 2-0 Vicryl. Scarpa's fascia was closed with interrupted 3-0 Vicryl. Skin was closed with running 4-0 Monocryl subcuticular followed by Dermabond. All counts were correct. He tolerated procedure well without apparent complications and was taken recovery in stable condition. PATIENT DISPOSITION:  PACU - hemodynamically stable.   Delay start of Pharmacological VTE agent (>24hrs) due to surgical blood loss or risk of bleeding:  no  Violeta GelinasBurke Celsey Asselin, MD, MPH, FACS Pager: [phone]  12/5/20171:59 PM

## 2016-07-07 NOTE — Progress Notes (Signed)
Patient ID: Travis Gould, male   DOB: 01/05/1971, 45 y.o.   MRN: [id number] Patient examined. I agree with Dr. Arita MissByerly's assessment. For repair of incarcerated RIH with mesh. Procedure, risks, and benefits discussed and he agrees. I marked his site. I discussed the expected post-op course.  Violeta GelinasBurke Emanuel Dowson, MD, MPH, FACS Trauma: [phone] General Surgery: [phone]

## 2016-07-07 NOTE — ED Provider Notes (Signed)
[facility]-EMERGENCY DEPT Provider Note   CSN: [id number] Arrival date & time: 07/06/16  2329  By signing my name below, I, Freida Busmaniana Omoyeni, attest that this documentation has been prepared under the direction and in the presence of Tomasita CrumbleAdeleke Iyanla Eilers, MD . Electronically Signed: Freida Busmaniana Omoyeni, Scribe. 07/07/2016. 3:03 AM.  History   Chief Complaint Chief Complaint  Patient presents with  . Inguinal Hernia   The history is provided by the patient. No language interpreter was used.    HPI Comments:  Adah PerlShane Q Lasala is a 45 y.o. male who presents to the Emergency Department complaining of constant abdominal pain x 5 days. Pt states he was evaluated in the ED on 07/04/16 for the same and was diagnosed with an inguinal hernia with partial SBO.  It was reduced by surgery at that time and he was advised to fu for surgery in the future.   He notes his pain has gradually worsened since that time. He reports associated vomiting. No alleviating factors noted. Pain is worse than when he came in last time.  Past Medical History:  Diagnosis Date  . Obesity     There are no active problems to display for this patient.   History reviewed. No pertinent surgical history.     Home Medications    Prior to Admission medications   Not on File    Family History No family history on file.  Social History Social History  Substance Use Topics  . Smoking status: Never Smoker  . Smokeless tobacco: Not on file  . Alcohol use Yes     Comment: occ     Allergies   Patient has no known allergies.   Review of Systems Review of Systems 10 systems reviewed and all are negative for acute change except as noted in the HPI.  Physical Exam Updated Vital Signs BP 118/86 (BP Location: Right Arm)   Pulse 88   Temp 98.3 F (36.8 C) (Oral)   Resp 18   Ht 5\' 8"  (1.727 m)   Wt 240 lb (108.9 kg)   SpO2 94%   BMI 36.49 kg/m   Physical Exam  Constitutional: He is oriented to person, place, and time. Vital  signs are normal. He appears well-developed and well-nourished.  Non-toxic appearance. He does not appear ill. No distress.  HENT:  Head: Normocephalic and atraumatic.  Nose: Nose normal.  Mouth/Throat: Oropharynx is clear and moist. No oropharyngeal exudate.  Eyes: Conjunctivae and EOM are normal. Pupils are equal, round, and reactive to light. No scleral icterus.  Neck: Normal range of motion. Neck supple. No tracheal deviation, no edema, no erythema and normal range of motion present. No thyroid mass and no thyromegaly present.  Cardiovascular: Normal rate, regular rhythm, S1 normal, S2 normal, normal heart sounds, intact distal pulses and normal pulses.  Exam reveals no gallop and no friction rub.   No murmur heard. Pulmonary/Chest: Effort normal and breath sounds normal. No respiratory distress. He has no wheezes. He has no rhonchi. He has no rales.  Abdominal: Soft. Normal appearance and bowel sounds are normal. He exhibits no distension, no ascites and no mass. There is no hepatosplenomegaly. There is no tenderness. There is no rebound, no guarding and no CVA tenderness. A hernia is present.  Large right inguinal hernia ~7cm in length and firm   Musculoskeletal: Normal range of motion. He exhibits no edema or tenderness.  Lymphadenopathy:    He has no cervical adenopathy.  Neurological: He is alert and oriented to  person, place, and time. He has normal strength. No cranial nerve deficit or sensory deficit.  Skin: Skin is warm, dry and intact. No petechiae and no rash noted. He is not diaphoretic. No erythema. No pallor.  Nursing note and vitals reviewed.    ED Treatments / Results  DIAGNOSTIC STUDIES:  Oxygen Saturation is 98% on RA, normal by my interpretation.    COORDINATION OF CARE:  3:01 AM Discussed treatment plan with pt at bedside and pt agreed to plan.  Labs (all labs ordered are listed, but only abnormal results are displayed) Labs Reviewed  CBC WITH  DIFFERENTIAL/PLATELET - Abnormal; Notable for the following:       Result Value   Monocytes Absolute 1.4 (*)    All other components within normal limits  COMPREHENSIVE METABOLIC PANEL - Abnormal; Notable for the following:    Chloride 98 (*)    Glucose, Bld 102 (*)    Creatinine, Ser 1.29 (*)    ALT 70 (*)    All other components within normal limits  URINALYSIS, ROUTINE W REFLEX MICROSCOPIC (NOT AT [facility]) - Abnormal; Notable for the following:    Color, Urine AMBER (*)    APPearance CLOUDY (*)    Specific Gravity, Urine 1.036 (*)    Hgb urine dipstick MODERATE (*)    Bilirubin Urine MODERATE (*)    Ketones, ur 15 (*)    All other components within normal limits  URINE MICROSCOPIC-ADD ON - Abnormal; Notable for the following:    Squamous Epithelial / LPF 0-5 (*)    Bacteria, UA RARE (*)    Casts HYALINE CASTS (*)    All other components within normal limits  LIPASE, BLOOD    EKG  EKG Interpretation None       Radiology Ct Abdomen Pelvis W Contrast  Result Date: 07/07/2016 CLINICAL DATA:  Worsening pain over the past several days. EXAM: CT ABDOMEN AND PELVIS WITH CONTRAST TECHNIQUE: Multidetector CT imaging of the abdomen and pelvis was performed using the standard protocol following bolus administration of intravenous contrast. CONTRAST:  ISOVUE-300 IOPAMIDOL (ISOVUE-300) INJECTION 61% COMPARISON:  07/04/2016 FINDINGS: Lower chest: No acute abnormality. Hepatobiliary: No focal liver abnormality is seen. No gallstones, gallbladder wall thickening, or biliary dilatation. Pancreas: Unremarkable. No pancreatic ductal dilatation or surrounding inflammatory changes. Spleen: Normal in size without focal abnormality. Adrenals/Urinary Tract: Adrenal glands are unremarkable. Kidneys are normal, without renal calculi, focal lesion, or hydronephrosis. Bladder is unremarkable. Stomach/Bowel: There is worsened dilatation of small bowel with abrupt transition to decompressed distal ileum  at the entrance to the right inguinal hernia. The hernia contains terminal ileum, cecum, and appendix. The remainder of the colon is remarkable only for uncomplicated diverticulosis. No extraluminal air. Vascular/Lymphatic: No significant vascular findings are present. No enlarged abdominal or pelvic lymph nodes. Reproductive: Unremarkable prostate. Other: No ascites. Musculoskeletal: No significant skeletal lesion. Benign bone lesion of the right iliac crest, as previously reported. IMPRESSION: Small bowel obstruction within the right inguinal hernia. The degree of dilatation and the caliber transition have worsened from 07/04/2016. Electronically Signed   By: Ellery Plunk M.D.   On: 07/07/2016 05:22    Procedures Procedures (including critical care time)  Medications Ordered in ED Medications  sodium chloride 0.9 % bolus 1,000 mL (1,000 mLs Intravenous New Bag/Given 07/07/16 0403)  morphine 4 MG/ML injection 6 mg (6 mg Intravenous Given 07/07/16 0405)  ondansetron (ZOFRAN) injection 4 mg (4 mg Intravenous Given 07/07/16 0403)  iopamidol (ISOVUE-300) 61 % injection (100  mLs Intravenous Contrast Given 07/07/16 0432)     Initial Impression / Assessment and Plan / ED Course  I have reviewed the triage vital signs and the nursing notes.  Pertinent labs & imaging results that were available during my care of the patient were reviewed by me and considered in my medical decision making (see chart for details).  Clinical Course    Patient presents to the ED for worsening abdominal pain and h/o partial SBO. I suspect he has a recurrence.  Wil repeat CT scan for evaluation.  Hernia is now firm and I can not reduce it.  He was given morphine for pain control. I anticipate admission.  5:52 AM CT reveals worsening SBO and worsening dilation.  I spoke with Dr. Donell BeersByerly who will evaluate the patient in the ED.  Final Clinical Impressions(s) / ED Diagnoses   Final diagnoses:  Right inguinal hernia  SBO  (small bowel obstruction)    New Prescriptions New Prescriptions   No medications on file     I personally performed the services described in this documentation, which was scribed in my presence. The recorded information has been reviewed and is accurate.       Tomasita CrumbleAdeleke Oluwanifemi Susman, MD 07/07/16 [phone]

## 2016-07-07 NOTE — ED Notes (Signed)
Attempted to call report

## 2016-07-07 NOTE — Progress Notes (Signed)
Pt admitted to 6N16 via bed from PACU.  Pt alert and oriented X4.  Pt on 2L O2 via [place].  Pt has 20G to rt hand with fluids infusing.  Pt has surgical incision to rt groin with skin glue clean, dry, intact.  SCDs in place.  Pt has no complaints at the moment.  Will continue to monitor.

## 2016-07-08 ENCOUNTER — Encounter (HOSPITAL_COMMUNITY): Payer: Self-pay | Admitting: General Practice

## 2016-07-08 LAB — BASIC METABOLIC PANEL
Anion gap: 5 (ref 5–15)
BUN: 9 mg/dL (ref 6–20)
CALCIUM: 8.9 mg/dL (ref 8.9–10.3)
CO2: 30 mmol/L (ref 22–32)
CREATININE: 1.21 mg/dL (ref 0.61–1.24)
Chloride: 103 mmol/L (ref 101–111)
GFR calc Af Amer: >60 mL/min (ref >60)
GFR calc non Af Amer: >60 mL/min (ref >60)
GLUCOSE: 111 mg/dL — AB (ref 65–99)
Potassium: 4.3 mmol/L (ref 3.5–5.1)
Sodium: 138 mmol/L (ref 135–145)

## 2016-07-08 MED ORDER — OXYCODONE-ACETAMINOPHEN 5-325 MG PO TABS: 1.0000 | ORAL_TABLET | ORAL | Status: DC | PRN

## 2016-07-08 MED ORDER — INFLUENZA VAC SPLIT QUAD 0.5 ML IM SUSY
0.5000 mL | PREFILLED_SYRINGE | INTRAMUSCULAR | Status: AC
  Administered 2016-07-08: 0.5 mL via INTRAMUSCULAR
  Filled 2016-07-08: qty 0.5

## 2016-07-08 MED ORDER — ACETAMINOPHEN 325 MG PO TABS: ORAL_TABLET | ORAL | Status: DC

## 2016-07-08 MED ORDER — OXYCODONE-ACETAMINOPHEN 5-325 MG PO TABS: 1.0000 | ORAL_TABLET | ORAL | 0 refills | Status: DC | PRN

## 2016-07-08 MED ORDER — DOCUSATE SODIUM 100 MG PO CAPS
ORAL_CAPSULE | ORAL | 0 refills | Status: DC
Start: 2016-07-08 — End: 2016-07-28

## 2016-07-08 NOTE — Progress Notes (Signed)
Patient's RN jose stated okay for patient to discharge. D/C instructions reviewed and given to patient with prescription. All questions answered and teachback used.

## 2016-07-08 NOTE — Discharge Summary (Signed)
Physician Discharge Summary  Patient ID: Travis PerlShane Q Paccione MRN: [id number] DOB/AGE: 44/10/1970 44 y.o.  Admit date: 07/07/2016 Discharge date: 07/08/2016  Admission Diagnoses:  Incarcerated RIH Mild creatinine elevation Body mass index is 36.6  Family history of hypertension/AODM  Discharge Diagnoses:  Same  Active Problems:   Incarcerated right inguinal hernia   PROCEDURES: Hernia repair Right inguinal hernia, insertion of mesh, [facility]07[facility]  Hospital Course:  Patient is a 45 year old male who presents with a right inguinal hernia that will not reduce. He has had hernia for multiple years, but states he has always been able to push it back in. Around 5 days ago it popped out and he had difficulty pushing it back in. He was seen in the ER 2-3 days ago and the hernia was able to be reduced. It popped back out within 24 hours. Yesterday he had some nausea and vomiting and decided to come back to the emergency department. He states the hernia is painful but he does not have any abdominal pain. He only had one episode of nausea and vomiting. He denies fevers and chills.  He had not had any hernia repair before.   Pt was seen in the ED and taken to surgery later that day by Dr. Janee Mornhompson.  Post op he did really well, tolerating diet that post op PM.  He was mobilized in the AM, tolerated a diet, and was ready for discharge by lunch time.  We did not place him on NSAID's because of his borderline renal function.  I have recommended he see a PCP and have that followed closely.    CBC Latest Ref Rng & Units 07/07/2016 07/04/2016  WBC 4.0 - 10.5 K/uL 9.1 9.9  Hemoglobin 13.0 - 17.0 g/dL 78.215.5 95.615.1  Hematocrit 21.339.0 - 52.0 % 45.5 44.3  Platelets 150 - 400 K/uL 174 200   CMP Latest Ref Rng & Units 07/08/2016 07/07/2016 07/04/2016  Glucose 65 - 99 mg/dL 086(V111(H) 784(O102(H) 96  BUN 6 - 20 mg/dL 9 15 13   Creatinine 0.61 - 1.24 mg/dL 9.621.21 9.52(W1.29(H) 4.131.20  Sodium 135 - 145 mmol/L 138 136 136  Potassium  3.5 - 5.1 mmol/L 4.3 3.6 3.6  Chloride 101 - 111 mmol/L 103 98(L) 102  CO2 22 - 32 mmol/L 30 27 24   Calcium 8.9 - 10.3 mg/dL 8.9 9.7 9.3  Total Protein 6.5 - 8.1 g/dL - 8.1 7.9  Total Bilirubin 0.3 - 1.2 mg/dL - 1.1 1.0  Alkaline Phos 38 - 126 U/L - 90 97  AST 15 - 41 U/L - 34 60(H)  ALT 17 - 63 U/L - 70(H) 78(H)    Disposition: 01-Home or Self Care     Medication List    TAKE these medications   acetaminophen 325 MG tablet Commonly known as:  TYLENOL You can use this as directed on the package.  Tylenol/Acetaminophen is in your prescribed pain medicine. Do not take more than 4000 mg of this per day.  If you get up to 10 tablets per day you are close to maximum dose of Tylenol/acetaminophen allowed per day.   docusate sodium 100 MG capsule Commonly known as:  COLACE You can use this to help keep stools soft.  Use as needed.  You can buy at any drug store without a prescription.   oxyCODONE-acetaminophen 5-325 MG tablet Commonly known as:  PERCOCET/ROXICET Take 1-2 tablets by mouth every 4 (four) hours as needed for moderate pain.      Follow-up Information  Liz MaladyHOMPSON,BURKE E, MD Follow up.   Specialty:  General Surgery Why:  CAll and make an appointment in 2-3 weeks. Contact information: [address] [place] [address] [phone]           Signed: Sherrie GeorgeJENNINGS,Javis Abboud 07/08/2016, 2:56 PM

## 2016-07-08 NOTE — Discharge Instructions (Signed)
[facility] _______[facility] ° °UMBILICAL OR INGUINAL HERNIA REPAIR: POST OP INSTRUCTIONS ° °Always review your discharge instruction sheet given to you by the facility where your surgery was performed. °IF YOU HAVE DISABILITY OR FAMILY LEAVE FORMS, YOU MUST BRING THEM TO THE OFFICE FOR PROCESSING.   °DO NOT GIVE THEM TO YOUR DOCTOR. ° °1. A  prescription for pain medication may be given to you upon discharge.  Take your pain medication as prescribed, if needed.  If narcotic pain medicine is not needed, then you may take acetaminophen (Tylenol) or ibuprofen (Advil) as needed. °2. Take your usually prescribed medications unless otherwise directed. °If you need a refill on your pain medication, please contact your pharmacy.  They will contact our office to request authorization. Prescriptions will not be filled after 5 pm or on week-ends. °3. You should follow a light diet the first 24 hours after arrival home, such as soup and crackers, etc.  Be sure to include lots of fluids daily.  Resume your normal diet the day after surgery. °4.Most patients will experience some swelling and bruising around the umbilicus or in the groin and scrotum.  Ice packs and reclining will help.  Swelling and bruising can take several days to resolve.  °6. It is common to experience some constipation if taking pain medication after surgery.  Increasing fluid intake and taking a stool softener (such as Colace) will usually help or prevent this problem from occurring.  A mild laxative (Milk of Magnesia or Miralax) should be taken according to package directions if there are no bowel movements after 48 hours. °7. Unless discharge instructions indicate otherwise, you may remove your bandages 24-48 hours after surgery, and you may shower at that time.  You may have steri-strips (small skin tapes) in place directly over the incision.  These strips should be left on the skin for 7-10 days.  If your surgeon used skin glue on the  incision, you may shower in 24 hours.  The glue will flake off over the next 2-3 weeks.  Any sutures or staples will be removed at the office during your follow-up visit. °8. ACTIVITIES:  You may resume regular (light) daily activities beginning the next day--such as daily self-care, walking, climbing stairs--gradually increasing activities as tolerated.  You may have sexual intercourse when it is comfortable.  Refrain from any heavy lifting or straining until approved by your doctor. ° °a.You may drive when you are no longer taking prescription pain medication, you can comfortably wear a seatbelt, and you can safely maneuver your car and apply brakes. °b.RETURN TO WORK:   °_____________________________________________ ° °9.You should see your doctor in the office for a follow-up appointment approximately 2-3 weeks after your surgery.  Make sure that you call for this appointment within a day or two after you arrive home to insure a convenient appointment time. °10.OTHER INSTRUCTIONS: _________________________ °   _____________________________________ ° °WHEN TO CALL YOUR DOCTOR: °1. Fever over 101.0 °2. Inability to urinate °3. Nausea and/or vomiting °4. Extreme swelling or bruising °5. Continued bleeding from incision. °6. Increased pain, redness, or drainage from the incision ° °The clinic staff is available to answer your questions during regular business hours.  Please don’t hesitate to call and ask to speak to one of the nurses for clinical concerns.  If you have a medical emergency, go to the nearest emergency room or call 911.  A surgeon from [facility] is always on call at the hospital ° ° °  [address], [address], [place], [place]  [address] ?  [address] [address], [place], [place]   [address] [phone] ? [phone] ? FAX [phone] Web site: [email or web address] _______[facility]

## 2016-07-08 NOTE — Progress Notes (Signed)
1 Day Post-Op  Subjective: He looks good this AM, tolerated diet, no BM so far.  Has not been OOB yet.    Objective: Vital signs in last 24 hours: Temp:  [97.7 F (36.5 C)-98.3 F (36.8 C)] 98.2 F (36.8 C) (12/06 0443) Pulse Rate:  [68-94] 83 (12/06 0443) Resp:  [9-18] 17 (12/06 0443) BP: (106-127)/(67-91) 115/70 (12/06 0443) SpO2:  [95 %-100 %] 98 % (12/06 0443) Weight:  [109.2 kg (240 lb 11.9 oz)] 109.2 kg (240 lb 11.9 oz) (12/05 1602) Last BM Date: 07/01/16 640 PO 2000 IV 875 urine recorded Afebrile, VSS BMP OK Intake/Output from previous day: 12/05 0701 - 12/06 0700 In: 3576.7 [P.O.:640; I.V.:1936.7; IV Piggyback:1000] Out: 925 [Urine:875; Blood:50] Intake/Output this shift: No intake/output data recorded.  General appearance: alert, cooperative and no distress Resp: clear to auscultation bilaterally GI: Soft, + BS, not tender, RIH site looks good.  No BM  Lab Results:   Recent Labs  07/07/16 0321  WBC 9.1  HGB 15.5  HCT 45.5  PLT 174    BMET  Recent Labs  07/07/16 0321 07/08/16 0630  NA 136 138  K 3.6 4.3  CL 98* 103  CO2 27 30  GLUCOSE 102* 111*  BUN 15 9  CREATININE 1.29* 1.21  CALCIUM 9.7 8.9   PT/INR No results for input(s): LABPROT, INR in the last 72 hours.   Recent Labs Lab 07/04/16 1323 07/07/16 0321  AST 60* 34  ALT 78* 70*  ALKPHOS 97 90  BILITOT 1.0 1.1  PROT 7.9 8.1  ALBUMIN 4.1 4.3     Lipase     Component Value Date/Time   LIPASE 26 07/07/2016 0321     Studies/Results: Ct Abdomen Pelvis W Contrast  Result Date: 07/07/2016 CLINICAL DATA:  Worsening pain over the past several days. EXAM: CT ABDOMEN AND PELVIS WITH CONTRAST TECHNIQUE: Multidetector CT imaging of the abdomen and pelvis was performed using the standard protocol following bolus administration of intravenous contrast. CONTRAST:  100ml ISOVUE-300 IOPAMIDOL (ISOVUE-300) INJECTION 61% COMPARISON:  07/04/2016 FINDINGS: Lower chest: No acute abnormality.  Hepatobiliary: No focal liver abnormality is seen. No gallstones, gallbladder wall thickening, or biliary dilatation. Pancreas: Unremarkable. No pancreatic ductal dilatation or surrounding inflammatory changes. Spleen: Normal in size without focal abnormality. Adrenals/Urinary Tract: Adrenal glands are unremarkable. Kidneys are normal, without renal calculi, focal lesion, or hydronephrosis. Bladder is unremarkable. Stomach/Bowel: There is worsened dilatation of small bowel with abrupt transition to decompressed distal ileum at the entrance to the right inguinal hernia. The hernia contains terminal ileum, cecum, and appendix. The remainder of the colon is remarkable only for uncomplicated diverticulosis. No extraluminal air. Vascular/Lymphatic: No significant vascular findings are present. No enlarged abdominal or pelvic lymph nodes. Reproductive: Unremarkable prostate. Other: No ascites. Musculoskeletal: No significant skeletal lesion. Benign bone lesion of the right iliac crest, as previously reported. IMPRESSION: Small bowel obstruction within the right inguinal hernia. The degree of dilatation and the caliber transition have worsened from 07/04/2016. Electronically Signed   By: Ellery Plunkaniel R Mitchell M.D.   On: 07/07/2016 05:22   Prior to Admission medications   Not on File     Medications: . docusate sodium  100 mg Oral BID   . dextrose 5 % and 0.45 % NaCl with KCl 20 mEq/L 100 mL/hr at 07/07/16 1626   Assessment/Plan INCARCERATED RIGHT INGUINAL HERNIA S/p Hernia repair Right inguinal hernia, insertion of mesh, 07/07/16,Dr.Burke Thompson FEN:  IV fluids/Regular diet ID: Ancef 2 gm IV preop DVT:  SCD  PLan:  Saline lock IV, mobilize and if he does well home later today.      LOS: 0 days    Esmae Donathan 07/08/2016 [phone]

## 2016-07-11 ENCOUNTER — Encounter (HOSPITAL_COMMUNITY): Payer: Self-pay

## 2016-07-11 ENCOUNTER — Emergency Department (HOSPITAL_COMMUNITY)
Admission: EM | Admit: 2016-07-11 | Discharge: 2016-07-11 | Disposition: A | Discharge status: Home / Self Care | Payer: Medicaid Other | Attending: Emergency Medicine | Admitting: Emergency Medicine

## 2016-07-11 DIAGNOSIS — R112 Nausea with vomiting, unspecified: Secondary | ICD-10-CM | POA: Insufficient documentation

## 2016-07-11 DIAGNOSIS — Z79899 Other long term (current) drug therapy: Secondary | ICD-10-CM | POA: Diagnosis not present

## 2016-07-11 LAB — COMPREHENSIVE METABOLIC PANEL
ALT: 37 U/L (ref 17–63)
AST: 21 U/L (ref 15–41)
Albumin: 3.8 g/dL (ref 3.5–5.0)
Alkaline Phosphatase: 93 U/L (ref 38–126)
Anion gap: 14 (ref 5–15)
BUN: 17 mg/dL (ref 6–20)
CO2: 25 mmol/L (ref 22–32)
Calcium: 9.7 mg/dL (ref 8.9–10.3)
Chloride: 97 mmol/L — ABNORMAL LOW (ref 101–111)
Creatinine, Ser: 1.13 mg/dL (ref 0.61–1.24)
GFR calc Af Amer: >60 mL/min (ref >60)
GFR calc non Af Amer: >60 mL/min (ref >60)
Glucose, Bld: 152 mg/dL — ABNORMAL HIGH (ref 65–99)
Potassium: 4 mmol/L (ref 3.5–5.1)
Sodium: 136 mmol/L (ref 135–145)
Total Bilirubin: 0.7 mg/dL (ref 0.3–1.2)
Total Protein: 8.8 g/dL — ABNORMAL HIGH (ref 6.5–8.1)

## 2016-07-11 LAB — URINALYSIS, ROUTINE W REFLEX MICROSCOPIC
Bacteria, UA: NONE SEEN
Bilirubin Urine: NEGATIVE
Glucose, UA: NEGATIVE mg/dL
Hgb urine dipstick: NEGATIVE
Ketones, ur: 20 mg/dL — AB
Leukocytes, UA: NEGATIVE
Nitrite: NEGATIVE
Protein, ur: 30 mg/dL — AB
Specific Gravity, Urine: 1.032 — ABNORMAL HIGH (ref 1.005–1.030)
pH: 5 (ref 5.0–8.0)

## 2016-07-11 LAB — CBC
HCT: 45.5 % (ref 39.0–52.0)
Hemoglobin: 16 g/dL (ref 13.0–17.0)
MCH: 30.1 pg (ref 26.0–34.0)
MCHC: 35.2 g/dL (ref 30.0–36.0)
MCV: 85.5 fL (ref 78.0–100.0)
Platelets: 206 10*3/uL (ref 150–400)
RBC: 5.32 MIL/uL (ref 4.22–5.81)
RDW: 13.2 % (ref 11.5–15.5)
WBC: 12.6 10*3/uL — ABNORMAL HIGH (ref 4.0–10.5)

## 2016-07-11 LAB — LIPASE, BLOOD: Lipase: 32 U/L (ref 11–51)

## 2016-07-11 MED ORDER — ONDANSETRON 4 MG PO TBDP
ORAL_TABLET | ORAL | Status: AC
  Administered 2016-07-11: 4 mg via ORAL
  Filled 2016-07-11: qty 1

## 2016-07-11 MED ORDER — METOCLOPRAMIDE HCL 5 MG/ML IJ SOLN
10.0000 mg | Freq: Once | INTRAMUSCULAR | Status: AC
  Administered 2016-07-11: 10 mg via INTRAVENOUS
  Filled 2016-07-11: qty 2

## 2016-07-11 MED ORDER — ONDANSETRON HCL 4 MG PO TABS: 4.0000 mg | ORAL_TABLET | Freq: Four times a day (QID) | ORAL | 0 refills | Status: DC

## 2016-07-11 MED ORDER — SODIUM CHLORIDE 0.9 % IV SOLN
8.0000 mg | Freq: Once | INTRAVENOUS | Status: AC
  Administered 2016-07-11: 8 mg via INTRAVENOUS
  Filled 2016-07-11: qty 4

## 2016-07-11 MED ORDER — SODIUM CHLORIDE 0.9 % IV BOLUS (SEPSIS)
1000.0000 mL | Freq: Once | INTRAVENOUS | Status: AC
  Administered 2016-07-11: 1000 mL via INTRAVENOUS

## 2016-07-11 MED ORDER — ONDANSETRON 4 MG PO TBDP
4.0000 mg | ORAL_TABLET | Freq: Once | ORAL | Status: AC | PRN
  Administered 2016-07-11: 4 mg via ORAL
  Filled 2016-07-11: qty 1

## 2016-07-11 NOTE — ED Notes (Signed)
Rt groin Incision site appropriate. Edges approximating well no purulent drainage noted.

## 2016-07-11 NOTE — ED Triage Notes (Signed)
Pt. Having n/v since yesterday denies any loose stools. Pt. Had hernia surgery this past Tuesday and was released on Wednesday.

## 2016-07-11 NOTE — ED Provider Notes (Signed)
[facility]EMERG[facility]Y DEPT Provider Note   CSN: [id number] Arrival date & time: 07/11/16  1354  By signing my name below, I, Rosario Adie, attest that this documentation has been prepared under the direction and in the presence of Raeford Razor, MD. Electronically Signed: Rosario Adie, ED Scribe. 07/11/16. 2:40 PM.  History   Chief Complaint Chief Complaint  Patient presents with  . Nausea  . Emesis   The history is provided by the patient. No language interpreter was used.    HPI Comments: Travis Gould is a 45 y.o. male who is 4 days s/p right inguinal hernia repair, with a PMHx of obesity, who presents to the Emergency Department complaining of intermittent episodes of nausea and vomiting onset approximately 4 days ago. Pt reports that he recently underwent right-sided inguinal hernia repair on 07/07/16 (~4 days ago) and was subsequently d/c'd from the hospital following his repair on 07/08/16. He notes associated lower, right-sided abdominal pain secondary to his nausea and vomiting. Pt additionally had a smaller than normal bowel movement this morning, but otherwise has been constipated since his surgery. He has been attempting to take magnesium hydroxide and a stool softener without relief. Pt has also been taking his rx'd Oxycodone 5-325mg  since being d/c which he notes will moderately relieve his nausea, vomiting, and abdominal pain; however, it always returns. His nausea and vomiting are exacerbated following PO intake. Pt denies any complications or pain surrounding his surgical incision site. No sick contacts with similar symptoms. He denies fever, or any other associated symptoms.  General Surgeon: Violeta Gelinas, MD  Past Medical History:  Diagnosis Date  . Hernia, inguinal 07/2016  . Obesity    Patient Active Problem List   Diagnosis Date Noted  . Incarcerated right inguinal hernia 07/07/2016   Past Surgical History:  Procedure Laterality Date  . INGUINAL  HERNIA REPAIR  07/07/2016  . INGUINAL HERNIA REPAIR Right 07/07/2016   Procedure: HERNIA REPAIR INGUINAL INCARCERATED;  Surgeon: Violeta Gelinas, MD;  Location: [facility] OR;  Service: General;  Laterality: Right;  . INSERTION OF MESH Right 07/07/2016   Procedure: INSERTION OF MESH;  Surgeon: Violeta Gelinas, MD;  Location: [facility] OR;  Service: General;  Laterality: Right;    Home Medications    Prior to Admission medications   Medication Sig Start Date End Date Taking? Authorizing Provider  acetaminophen (TYLENOL) 325 MG tablet You can use this as directed on the package.  Tylenol/Acetaminophen is in your prescribed pain medicine. Do not take more than 4000 mg of this per day.  If you get up to 10 tablets per day you are close to maximum dose of Tylenol/acetaminophen allowed per day. 07/08/16   Sherrie George, PA-C  docusate sodium (COLACE) 100 MG capsule You can use this to help keep stools soft.  Use as needed.  You can buy at any drug store without a prescription. 07/08/16   Sherrie George, PA-C  oxyCODONE-acetaminophen (PERCOCET/ROXICET) 5-325 MG tablet Take 1-2 tablets by mouth every 4 (four) hours as needed for moderate pain. 07/08/16   Sherrie George, PA-C   Family History No family history on file.  Social History Social History  Substance Use Topics  . Smoking status: Never Smoker  . Smokeless tobacco: Never Used  . Alcohol use Yes     Comment: occ   Allergies   Patient has no known allergies.   Review of Systems Review of Systems  Constitutional: Negative for fever.  Gastrointestinal: Positive for abdominal pain, constipation, nausea and  vomiting.  Skin: Positive for wound. Negative for color change and rash.  All other systems reviewed and are negative.  Physical Exam Updated Vital Signs BP 114/87 (BP Location: Left Arm)   Pulse 113   Temp 98.4 F (36.9 C) (Oral)   Resp 18   Ht 5\' 8"  (1.727 m)   Wt 240 lb (108.9 kg)   SpO2 98%   BMI 36.49 kg/m   Physical Exam    Constitutional: He appears well-developed and well-nourished.  HENT:  Head: Normocephalic.  Right Ear: External ear normal.  Left Ear: External ear normal.  Nose: Nose normal.  Eyes: Conjunctivae are normal. Right eye exhibits no discharge. Left eye exhibits no discharge.  Neck: Normal range of motion.  Cardiovascular: Normal rate, regular rhythm and normal heart sounds.   No murmur heard. Pulmonary/Chest: Effort normal and breath sounds normal. No respiratory distress. He has no wheezes. He has no rales.  Abdominal: Soft. There is tenderness. There is no rebound and no guarding.  Mild epigastric tenderness. Surgical site of the right-groin appears to be healing well. Appropriate incisional tenderness.   Musculoskeletal: Normal range of motion. He exhibits no edema or tenderness.  Neurological: He is alert. No cranial nerve deficit. Coordination normal.  Skin: Skin is warm and dry. No erythema. No pallor.  Psychiatric: He has a normal mood and affect. His behavior is normal.  Nursing note and vitals reviewed.  ED Treatments / Results  DIAGNOSTIC STUDIES: Oxygen Saturation is 98% on RA, normal by my interpretation.   COORDINATION OF CARE: 2:40 PM-Discussed next steps with pt. Pt verbalized understanding and is agreeable with the plan.   Labs (all labs ordered are listed, but only abnormal results are displayed) Labs Reviewed  COMPREHENSIVE METABOLIC PANEL - Abnormal; Notable for the following:       Result Value   Chloride 97 (*)    Glucose, Bld 152 (*)    Total Protein 8.8 (*)    All other components within normal limits  CBC - Abnormal; Notable for the following:    WBC 12.6 (*)    All other components within normal limits  URINALYSIS, ROUTINE W REFLEX MICROSCOPIC - Abnormal; Notable for the following:    Color, Urine AMBER (*)    APPearance HAZY (*)    Specific Gravity, Urine 1.032 (*)    Ketones, ur 20 (*)    Protein, ur 30 (*)    Squamous Epithelial / LPF 0-5 (*)     All other components within normal limits  LIPASE, BLOOD   EKG  EKG Interpretation None      Radiology No results found.  Procedures Procedures   Medications Ordered in ED Medications  ondansetron (ZOFRAN) 8 mg in sodium chloride 0.9 % 50 mL IVPB (not administered)  ondansetron (ZOFRAN-ODT) disintegrating tablet 4 mg (4 mg Oral Given by Other 07/11/16 1509)  sodium chloride 0.9 % bolus 1,000 mL (1,000 mLs Intravenous New Bag/Given 07/11/16 1507)  metoCLOPramide (REGLAN) injection 10 mg (10 mg Intravenous Given 07/11/16 1508)   Initial Impression / Assessment and Plan / ED Course  I have reviewed the triage vital signs and the nursing notes.  Pertinent labs & imaging results that were available during my care of the patient were reviewed by me and considered in my medical decision making (see chart for details).  Clinical Course    45yM with n/v. Status post recent inguinal hernia repair. His diet has not been the greatest postop including ox tail and fried chicken  livers. Pain medication may be contributing to nausea and vomiting as well. He continues to feel nauseated although improved. No vomiting while in the emergency room and he has been drinking water in the ED. Will discharge with a prescription for as needed Zofran. Recommended a bland diet and advance slowly. Would add a laxative as well.   Final Clinical Impressions(s) / ED Diagnoses   Final diagnoses:  Nausea and vomiting, intractability of vomiting not specified, unspecified vomiting type   New Prescriptions New Prescriptions   No medications on file   I personally preformed the services scribed in my presence. The recorded information has been reviewed is accurate. Raeford RazorStephen Koby Hartfield, MD.     Raeford RazorStephen Michelangelo Rindfleisch, MD 07/14/16 [phone]

## 2016-07-12 ENCOUNTER — Emergency Department (HOSPITAL_COMMUNITY): Payer: Medicaid Other

## 2016-07-12 ENCOUNTER — Encounter (HOSPITAL_COMMUNITY): Payer: Self-pay

## 2016-07-12 ENCOUNTER — Inpatient Hospital Stay (HOSPITAL_COMMUNITY)
Admission: EM | Admit: 2016-07-12 | Discharge: 2016-07-28 | DRG: 853 | Disposition: A | Discharge status: Home Health | Payer: Medicaid Other | Attending: General Surgery | Admitting: General Surgery

## 2016-07-12 DIAGNOSIS — E669 Obesity, unspecified: Secondary | ICD-10-CM | POA: Diagnosis present

## 2016-07-12 DIAGNOSIS — G9341 Metabolic encephalopathy: Secondary | ICD-10-CM | POA: Diagnosis not present

## 2016-07-12 DIAGNOSIS — R739 Hyperglycemia, unspecified: Secondary | ICD-10-CM | POA: Diagnosis not present

## 2016-07-12 DIAGNOSIS — E8770 Fluid overload, unspecified: Secondary | ICD-10-CM | POA: Diagnosis not present

## 2016-07-12 DIAGNOSIS — N179 Acute kidney failure, unspecified: Secondary | ICD-10-CM | POA: Diagnosis present

## 2016-07-12 DIAGNOSIS — E870 Hyperosmolality and hypernatremia: Secondary | ICD-10-CM | POA: Diagnosis not present

## 2016-07-12 DIAGNOSIS — K567 Ileus, unspecified: Secondary | ICD-10-CM | POA: Diagnosis not present

## 2016-07-12 DIAGNOSIS — K4031 Unilateral inguinal hernia, with obstruction, without gangrene, recurrent: Secondary | ICD-10-CM | POA: Diagnosis present

## 2016-07-12 DIAGNOSIS — R6521 Severe sepsis with septic shock: Secondary | ICD-10-CM | POA: Diagnosis present

## 2016-07-12 DIAGNOSIS — R109 Unspecified abdominal pain: Secondary | ICD-10-CM

## 2016-07-12 DIAGNOSIS — K660 Peritoneal adhesions (postprocedural) (postinfection): Secondary | ICD-10-CM | POA: Diagnosis present

## 2016-07-12 DIAGNOSIS — E876 Hypokalemia: Secondary | ICD-10-CM | POA: Diagnosis present

## 2016-07-12 DIAGNOSIS — E872 Acidosis, unspecified: Secondary | ICD-10-CM

## 2016-07-12 DIAGNOSIS — E861 Hypovolemia: Secondary | ICD-10-CM | POA: Diagnosis present

## 2016-07-12 DIAGNOSIS — K659 Peritonitis, unspecified: Secondary | ICD-10-CM | POA: Diagnosis not present

## 2016-07-12 DIAGNOSIS — R579 Shock, unspecified: Secondary | ICD-10-CM | POA: Diagnosis present

## 2016-07-12 DIAGNOSIS — D72829 Elevated white blood cell count, unspecified: Secondary | ICD-10-CM

## 2016-07-12 DIAGNOSIS — J9811 Atelectasis: Secondary | ICD-10-CM | POA: Diagnosis not present

## 2016-07-12 DIAGNOSIS — Z6836 Body mass index (BMI) 36.0-36.9, adult: Secondary | ICD-10-CM

## 2016-07-12 DIAGNOSIS — R000 Tachycardia, unspecified: Secondary | ICD-10-CM | POA: Diagnosis present

## 2016-07-12 DIAGNOSIS — J9690 Respiratory failure, unspecified, unspecified whether with hypoxia or hypercapnia: Secondary | ICD-10-CM

## 2016-07-12 DIAGNOSIS — K56609 Unspecified intestinal obstruction, unspecified as to partial versus complete obstruction: Secondary | ICD-10-CM

## 2016-07-12 DIAGNOSIS — A419 Sepsis, unspecified organism: Principal | ICD-10-CM | POA: Diagnosis present

## 2016-07-12 DIAGNOSIS — R112 Nausea with vomiting, unspecified: Secondary | ICD-10-CM

## 2016-07-12 DIAGNOSIS — K460 Unspecified abdominal hernia with obstruction, without gangrene: Secondary | ICD-10-CM | POA: Diagnosis present

## 2016-07-12 DIAGNOSIS — K4090 Unilateral inguinal hernia, without obstruction or gangrene, not specified as recurrent: Secondary | ICD-10-CM

## 2016-07-12 DIAGNOSIS — Z789 Other specified health status: Secondary | ICD-10-CM

## 2016-07-12 DIAGNOSIS — J9601 Acute respiratory failure with hypoxia: Secondary | ICD-10-CM | POA: Diagnosis not present

## 2016-07-12 DIAGNOSIS — E46 Unspecified protein-calorie malnutrition: Secondary | ICD-10-CM | POA: Diagnosis not present

## 2016-07-12 DIAGNOSIS — Z452 Encounter for adjustment and management of vascular access device: Secondary | ICD-10-CM

## 2016-07-12 DIAGNOSIS — J40 Bronchitis, not specified as acute or chronic: Secondary | ICD-10-CM | POA: Diagnosis not present

## 2016-07-12 DIAGNOSIS — E781 Pure hyperglyceridemia: Secondary | ICD-10-CM | POA: Diagnosis not present

## 2016-07-12 LAB — LIPASE, BLOOD: LIPASE: 18 U/L (ref 11–51)

## 2016-07-12 LAB — CBC WITH DIFFERENTIAL/PLATELET
BASOS ABS: 0.1 10*3/uL (ref 0.0–0.1)
Basophils Relative: 1 %
EOS PCT: 0 %
Eosinophils Absolute: 0 10*3/uL (ref 0.0–0.7)
HEMATOCRIT: 55 % — AB (ref 39.0–52.0)
HEMOGLOBIN: 18.8 g/dL — AB (ref 13.0–17.0)
LYMPHS ABS: 1.4 10*3/uL (ref 0.7–4.0)
LYMPHS PCT: 15 %
MCH: 30.2 pg (ref 26.0–34.0)
MCHC: 34.2 g/dL (ref 30.0–36.0)
MCV: 88.4 fL (ref 78.0–100.0)
MONOS PCT: 14 %
Monocytes Absolute: 1.3 10*3/uL — ABNORMAL HIGH (ref 0.1–1.0)
NEUTROS ABS: 6.7 10*3/uL (ref 1.7–7.7)
Neutrophils Relative %: 70 %
Platelets: 257 10*3/uL (ref 150–400)
RBC: 6.22 MIL/uL — AB (ref 4.22–5.81)
RDW: 14 % (ref 11.5–15.5)
WBC: 9.5 10*3/uL (ref 4.0–10.5)

## 2016-07-12 LAB — COMPREHENSIVE METABOLIC PANEL
ALBUMIN: 3.3 g/dL — AB (ref 3.5–5.0)
ALK PHOS: 85 U/L (ref 38–126)
ALT: <5 U/L — ABNORMAL LOW (ref 17–63)
AST: 61 U/L — AB (ref 15–41)
Anion gap: 23 — ABNORMAL HIGH (ref 5–15)
BILIRUBIN TOTAL: 0.8 mg/dL (ref 0.3–1.2)
BUN: 40 mg/dL — AB (ref 6–20)
CALCIUM: 8.5 mg/dL — AB (ref 8.9–10.3)
CO2: 19 mmol/L — ABNORMAL LOW (ref 22–32)
CREATININE: 3.36 mg/dL — AB (ref 0.61–1.24)
Chloride: 90 mmol/L — ABNORMAL LOW (ref 101–111)
GFR calc Af Amer: 24 mL/min — ABNORMAL LOW (ref >60)
GFR, EST NON AFRICAN AMERICAN: 21 mL/min — AB (ref >60)
GLUCOSE: 226 mg/dL — AB (ref 65–99)
Potassium: 3.6 mmol/L (ref 3.5–5.1)
Sodium: 132 mmol/L — ABNORMAL LOW (ref 135–145)
TOTAL PROTEIN: 7.7 g/dL (ref 6.5–8.1)

## 2016-07-12 LAB — I-STAT CHEM 8, ED
BUN: 42 mg/dL — AB (ref 6–20)
CALCIUM ION: 0.94 mmol/L — AB (ref 1.15–1.40)
CREATININE: 2.8 mg/dL — AB (ref 0.61–1.24)
Chloride: 99 mmol/L — ABNORMAL LOW (ref 101–111)
GLUCOSE: 225 mg/dL — AB (ref 65–99)
HCT: 53 % — ABNORMAL HIGH (ref 39.0–52.0)
HEMOGLOBIN: 18 g/dL — AB (ref 13.0–17.0)
Potassium: 3.7 mmol/L (ref 3.5–5.1)
Sodium: 134 mmol/L — ABNORMAL LOW (ref 135–145)
TCO2: 19 mmol/L (ref 0–100)

## 2016-07-12 LAB — LACTIC ACID, PLASMA: Lactic Acid, Venous: 11.3 mmol/L (ref 0.5–1.9)

## 2016-07-12 MED ORDER — IOPAMIDOL (ISOVUE-300) INJECTION 61%
INTRAVENOUS | Status: AC
  Filled 2016-07-12: qty 30

## 2016-07-12 MED ORDER — SODIUM CHLORIDE 0.9 % IV BOLUS (SEPSIS)
1000.0000 mL | Freq: Once | INTRAVENOUS | Status: AC
  Administered 2016-07-12: 1000 mL via INTRAVENOUS

## 2016-07-12 MED ORDER — VANCOMYCIN HCL 10 G IV SOLR
2000.0000 mg | Freq: Once | INTRAVENOUS | Status: AC
  Administered 2016-07-12: 2000 mg via INTRAVENOUS
  Filled 2016-07-12: qty 2000

## 2016-07-12 MED ORDER — PROMETHAZINE HCL 25 MG/ML IJ SOLN
12.5000 mg | Freq: Four times a day (QID) | INTRAMUSCULAR | Status: DC | PRN
  Administered 2016-07-12 – 2016-07-21 (×2): 12.5 mg via INTRAVENOUS
  Filled 2016-07-12 (×2): qty 1

## 2016-07-12 MED ORDER — PROCHLORPERAZINE EDISYLATE 5 MG/ML IJ SOLN
10.0000 mg | Freq: Once | INTRAMUSCULAR | Status: AC
  Administered 2016-07-12: 10 mg via INTRAVENOUS
  Filled 2016-07-12: qty 2

## 2016-07-12 MED ORDER — PROMETHAZINE HCL 25 MG/ML IJ SOLN
12.5000 mg | Freq: Once | INTRAMUSCULAR | Status: AC
  Administered 2016-07-12: 12.5 mg via INTRAVENOUS
  Filled 2016-07-12: qty 1

## 2016-07-12 MED ORDER — PIPERACILLIN-TAZOBACTAM 3.375 G IVPB 30 MIN
3.3750 g | Freq: Once | INTRAVENOUS | Status: AC
  Administered 2016-07-12: 3.375 g via INTRAVENOUS
  Filled 2016-07-12: qty 50

## 2016-07-12 NOTE — ED Provider Notes (Signed)
[facility] DEPT Provider Note   CSN: [id number] Arrival date & time: 07/12/16  1831     History   Chief Complaint Chief Complaint  Patient presents with  . Abdominal Pain  . Tachycardia    HPI Travis Gould is a 45 y.o. male.  HPI Patient is a 45 year old male status post recent incarcerated right inguinal hernia repair on 07/07/16 who presents with nausea, vomiting and diarrhea. Patient initially presented on 12/5 for irreducible right inguinal hernia. Underwent surgical repair for incarcerated right inguinal hernia which he tolerated well. He reports he has had intermittent nausea and some vomiting since discharge. Side in ED yesterday for the n/v. At the time, he had unremarkable lab work up, received IV antiemetics and was tolerating po fluids. He was discharged in stable condition. Over the past 24 hours, patient has had worsening nausea, vomiting, and diarrhea. Reports >10 episodes of emesis today. Initially stomach contents, but the last few had have had streaks of blood. Multiple episodes of watery stools. He has been unable to eat or drink and feels generalized weakness. Reports mild abdominal discomfort. No fevers or chills. He is making urine normally, denies dysuria, hematuria. He has been taking stool softeners including MOM and docusate.   Past Medical History:  Diagnosis Date  . Hernia, inguinal 07/2016  . Obesity     Patient Active Problem List   Diagnosis Date Noted  . Incarcerated right inguinal hernia 07/07/2016    Past Surgical History:  Procedure Laterality Date  . INGUINAL HERNIA REPAIR  07/07/2016  . INGUINAL HERNIA REPAIR Right 07/07/2016   Procedure: HERNIA REPAIR INGUINAL INCARCERATED;  Surgeon: Violeta Gelinas, MD;  Location: [facility] OR;  Service: General;  Laterality: Right;  . INSERTION OF MESH Right 07/07/2016   Procedure: INSERTION OF MESH;  Surgeon: Violeta Gelinas, MD;  Location: [facility] OR;  Service: General;  Laterality: Right;       Home  Medications    Prior to Admission medications   Medication Sig Start Date End Date Taking? Authorizing Provider  docusate sodium (COLACE) 100 MG capsule You can use this to help keep stools soft.  Use as needed.  You can buy at any drug store without a prescription. Patient taking differently: Take 100 mg by mouth daily as needed for mild constipation. You can use this to help keep stools soft.  Use as needed.  You can buy at any drug store without a prescription. 07/08/16  Yes Sherrie George, PA-C  ibuprofen (ADVIL,MOTRIN) 200 MG tablet Take 200-400 mg by mouth every 6 (six) hours as needed (for pain).   Yes Historical Provider, MD  ondansetron (ZOFRAN) 4 MG tablet Take 1 tablet (4 mg total) by mouth every 6 (six) hours. 07/11/16  Yes Raeford Razor, MD  oxyCODONE-acetaminophen (PERCOCET/ROXICET) 5-325 MG tablet Take 1-2 tablets by mouth every 4 (four) hours as needed for moderate pain. Patient taking differently: Take 1-2 tablets by mouth every 4 (four) hours as needed (for pain).  07/08/16  Yes Sherrie George, PA-C  acetaminophen (TYLENOL) 325 MG tablet You can use this as directed on the package.  Tylenol/Acetaminophen is in your prescribed pain medicine. Do not take more than 4000 mg of this per day.  If you get up to 10 tablets per day you are close to maximum dose of Tylenol/acetaminophen allowed per day. Patient not taking: Reported on 07/12/2016 07/08/16   Sherrie George, PA-C    Family History History reviewed. No pertinent family history.  Social History Social History  Substance Use Topics  . Smoking status: Never Smoker  . Smokeless tobacco: Never Used  . Alcohol use Yes     Comment: occ     Allergies   Patient has no known allergies.   Review of Systems Review of Systems  Constitutional: Positive for appetite change and fatigue. Negative for chills and fever.  HENT: Negative for ear pain and sore throat.   Eyes: Negative for pain and visual disturbance.    Respiratory: Negative for cough and shortness of breath.   Cardiovascular: Negative for chest pain and palpitations.  Gastrointestinal: Positive for abdominal pain, diarrhea, nausea and vomiting. Negative for blood in stool.  Genitourinary: Negative for dysuria and hematuria.  Musculoskeletal: Negative for arthralgias and back pain.  Skin: Negative for color change and rash.  Neurological: Negative for seizures and syncope.  All other systems reviewed and are negative.    Physical Exam Updated Vital Signs BP 112/65   Pulse (!) 144   Temp 99.6 F (37.6 C) (Oral)   Resp (!) 30   SpO2 100%   Physical Exam  Constitutional: He is oriented to person, place, and time. He appears well-developed and well-nourished. He appears distressed.  HENT:  Head: Normocephalic and atraumatic.  Eyes: Conjunctivae are normal.  Neck: Neck supple.  Cardiovascular: Regular rhythm.  Tachycardia present.   No murmur heard. Pulmonary/Chest: Effort normal and breath sounds normal. No respiratory distress.  Abdominal: Soft. He exhibits distension (mild distension, compressible). There is tenderness (Lower abdominal TTP).  RLQ incision site TTP but soft, c/d/i  Musculoskeletal: He exhibits no edema.  Neurological: He is alert and oriented to person, place, and time.  Skin: Skin is warm and dry.  Psychiatric: He has a normal mood and affect.  Nursing note and vitals reviewed.    ED Treatments / Results  Labs (all labs ordered are listed, but only abnormal results are displayed) Labs Reviewed  CBC WITH DIFFERENTIAL/PLATELET - Abnormal; Notable for the following:       Result Value   RBC 6.22 (*)    Hemoglobin 18.8 (*)    HCT 55.0 (*)    Monocytes Absolute 1.3 (*)    All other components within normal limits  COMPREHENSIVE METABOLIC PANEL - Abnormal; Notable for the following:    Sodium 132 (*)    Chloride 90 (*)    CO2 19 (*)    Glucose, Bld 226 (*)    BUN 40 (*)    Creatinine, Ser 3.36 (*)     Calcium 8.5 (*)    Albumin 3.3 (*)    AST 61 (*)    ALT <5 (*)    GFR calc non Af Amer 21 (*)    GFR calc Af Amer 24 (*)    Anion gap 23 (*)    All other components within normal limits  LACTIC ACID, PLASMA - Abnormal; Notable for the following:    Lactic Acid, Venous 11.3 (*)    All other components within normal limits  LACTIC ACID, PLASMA - Abnormal; Notable for the following:    Lactic Acid, Venous 8.7 (*)    All other components within normal limits  I-STAT CHEM 8, ED - Abnormal; Notable for the following:    Sodium 134 (*)    Chloride 99 (*)    BUN 42 (*)    Creatinine, Ser 2.80 (*)    Glucose, Bld 225 (*)    Calcium, Ion 0.94 (*)    Hemoglobin 18.0 (*)    HCT  53.0 (*)    All other components within normal limits  CULTURE, BLOOD (ROUTINE X 2)  CULTURE, BLOOD (ROUTINE X 2)  URINE CULTURE  LIPASE, BLOOD  URINALYSIS, ROUTINE W REFLEX MICROSCOPIC    EKG  EKG Interpretation  Date/Time:  Sunday July 12 2016 18:32:20 EST Ventricular Rate:  149 PR Interval:    QRS Duration: 74 QT Interval:  274 QTC Calculation: 432 R Axis:   42 Text Interpretation:  Sinus tachycardia Abnormal R-wave progression, early transition Borderline T wave abnormalities No old tracing to compare Confirmed by [facility] HospitalGLICK  MD, DAVID ([id number]) on 07/12/2016 11:16:44 PM       Radiology Ct Abdomen Pelvis Wo Contrast  Result Date: 07/12/2016 CLINICAL DATA:  45 year old male with pain and swelling of the left testicle radiating into the stomach. Evaluate for distal bowel obstruction. Recent right inguinal hernia repair. EXAM: CT ABDOMEN AND PELVIS WITHOUT CONTRAST TECHNIQUE: Multidetector CT imaging of the abdomen and pelvis was performed following the standard protocol without IV contrast. COMPARISON:  Abdominal CT dated 07/07/2016 FINDINGS: Evaluation of this exam is limited in the absence of intravenous contrast. Lower chest: Minimal left lung base atelectatic changes. The visualized portions of the  lungs are otherwise clear. No intra-abdominal free air or free fluid. Hepatobiliary: No focal liver abnormality is seen. No gallstones, gallbladder wall thickening, or biliary dilatation. Pancreas: Unremarkable. No pancreatic ductal dilatation or surrounding inflammatory changes. Spleen: Normal in size without focal abnormality. Adrenals/Urinary Tract: Adrenal glands are unremarkable. Kidneys are normal, without renal calculi, focal lesion, or hydronephrosis. Bladder is unremarkable. Stomach/Bowel: Evaluation of the bowel is limited in the absence of oral contrast. The stomach is distended with fluid content. There is dilatation of fluid-filled loops of small bowel throughout the abdomen with air-fluid level. The small bowel measures up to 4.8 cm in caliber. A transition zone is noted in the right inguinal hernia. The cecum and the appendix remain in the right lower quadrant above the inguinal ligament. The appendix appears unremarkable. Small amount of fluid noted throughout the colon. The colon is otherwise decompressed. Vascular/Lymphatic: The abdominal aorta and IVC appear grossly unremarkable on this noncontrast study. No portal venous gas identified. There is no adenopathy. Reproductive: The prostate and seminal vesicles are grossly unremarkable. Other: There is a 5.5 cm right inguinal defect with herniation of the distal small bowel and distal ileum into the right inguinal canal. A transition zone is noted in the right inguinal canal. Fluid-filled and mildly dilated loop of small bowel is noted within the right inguinal hernia. There is inflammatory changes with a small amount of fluid in the right inguinal hernia. There is thickening of the scrotal wall with a small hydrocele. There is stranding of the subcutaneous fat adjacent to the inguinal hernia with a small speck of subcutaneous air likely related to recent surgery. She small pockets of air within the right inguinal hernia appear to be intraluminal. No  evidence of pneumatosis. Musculoskeletal: No acute osseous pathology identified. Right iliac bone lesion as seen on the prior studies. IMPRESSION: Recurrent right inguinal hernia defect containing distal ileum resulting in small-bowel obstruction with transition zone in the distal ileum within the right inguinal hernia. The cecum and appendix remain in the right lower quadrant above the inguinal ligament. No evidence of pneumatosis. No portal venous gas. Electronically Signed   By: Elgie CollardArash  Radparvar M.D.   On: 07/12/2016 22:38   Dg Chest Portable 1 View  Result Date: 07/12/2016 CLINICAL DATA:  Right-sided chest and abdominal pain  EXAM: PORTABLE CHEST 1 VIEW COMPARISON:  None. FINDINGS: Cardiac shadow is within normal limits. The lungs are well aerated with mild left basilar atelectasis. No focal confluent infiltrate is seen. No effusion or pneumothorax is noted. No bony abnormality is seen. IMPRESSION: Minimal left basilar atelectasis. Electronically Signed   By: Alcide Clever M.D.   On: 07/12/2016 19:12    Procedures Procedures (including critical care time)  Medications Ordered in ED Medications  iopamidol (ISOVUE-300) 61 % injection (not administered)  promethazine (PHENERGAN) injection 12.5 mg (12.5 mg Intravenous Given 07/12/16 2114)  vancomycin (VANCOCIN) 2,000 mg in sodium chloride 0.9 % 500 mL IVPB (2,000 mg Intravenous New Bag/Given 07/12/16 2301)  sodium chloride 0.9 % bolus 1,000 mL (0 mLs Intravenous Stopped 07/12/16 1951)  sodium chloride 0.9 % bolus 1,000 mL (0 mLs Intravenous Stopped 07/12/16 1954)  promethazine (PHENERGAN) injection 12.5 mg (12.5 mg Intravenous Given 07/12/16 1909)  sodium chloride 0.9 % bolus 1,000 mL (0 mLs Intravenous Stopped 07/12/16 2059)  prochlorperazine (COMPAZINE) injection 10 mg (10 mg Intravenous Given 07/12/16 2222)  sodium chloride 0.9 % bolus 1,000 mL (0 mLs Intravenous Stopped 07/12/16 2257)  piperacillin-tazobactam (ZOSYN) IVPB 3.375 g (0 g  Intravenous Stopped 07/12/16 2309)  sodium chloride 0.9 % bolus 1,000 mL (0 mLs Intravenous Stopped 07/13/16 0008)     Initial Impression / Assessment and Plan / ED Course  I have reviewed the triage vital signs and the nursing notes.  Pertinent labs & imaging results that were available during my care of the patient were reviewed by me and considered in my medical decision making (see chart for details).  Clinical Course    Patient is a 45 year old male status post recent right inguinal hernia repair who presents with nausea, vomiting, diarrhea and abdominal discomfort. Borderline febrile, tachycardic to 150s, and hypotensive w/ SBP 80s on arrival. Patient is mentating appropriately. CBG 182 prior to arrival. Exam otherwise as above. IV fluid bolus given 2L. IV antiemetics given. CXR shows no free air under the diaphragm, no PNA. EKG w/ sinus tachycardia. Labs significant for AKI and severe lactic acidosis. CT abdomen and pelvis without contrast was ordered due to AKI, to r/o obstruction. Patient reassessed after 3L IVF and tachycardia had improved to 120s. SBP improved to 110s. However, patient began to be hypotensive following CT scan and additional IVF bolus given with improvement in SBPs. IV Vanc/Zosyn started for broad spectrum abx coverage. Received a total of 5L IVF for tachycardia and hypotension. Received IV Zofran, Phenergan, and Compazine for nausea. CT imaging significant for recurrence of hernia with transition point concerning for bowel obstruction.  General surgery consulted, Dr. Drexel Iha. Plan to take patient to the operating room for exploration.   Patient care supervised by Dr. Fayrene Fearing, ED attending  Final Clinical Impressions(s) / ED Diagnoses   Final diagnoses:  Abdominal pain  Abdominal pain, unspecified abdominal location  Lactic acidosis  Nausea and vomiting, intractability of vomiting not specified, unspecified vomiting type  Intestinal obstruction, unspecified cause,  unspecified whether partial or complete  Right inguinal hernia    New Prescriptions New Prescriptions   No medications on file     Isa Rankin, MD 07/13/16 1610    Rolland Porter, MD 07/19/16 1504

## 2016-07-12 NOTE — H&P (Signed)
Travis Gould is an 45 y.o. male.   Chief Complaint: abdominal pain HPI: 45 yo male underwent right open inguinal hernia recently for bowel obstruction. He was discharge from the hospital and then yesterday began having abdominal pain and nausea. He was sent home and then began having horrible diarrhea and came back to the ER. He now has nausea and vomiting and diffuse abdominal pain that is constant  Past Medical History:  Diagnosis Date  . Hernia, inguinal 07/2016  . Obesity     Past Surgical History:  Procedure Laterality Date  . INGUINAL HERNIA REPAIR  07/07/2016  . INGUINAL HERNIA REPAIR Right 07/07/2016   Procedure: HERNIA REPAIR INGUINAL INCARCERATED;  Surgeon: Georganna Skeans, MD;  Location: [place];  Service: General;  Laterality: Right;  . INSERTION OF MESH Right 07/07/2016   Procedure: INSERTION OF MESH;  Surgeon: Georganna Skeans, MD;  Location: [place];  Service: General;  Laterality: Right;    History reviewed. No pertinent family history. Social History:  reports that he has never smoked. He has never used smokeless tobacco. He reports that he drinks alcohol. He reports that he uses drugs, including Marijuana.  Allergies: No Known Allergies   (Not in a hospital admission)  Results for orders placed or performed during the hospital encounter of 07/12/16 (from the past 48 hour(s))  CBC with Differential     Status: Abnormal   Collection Time: 07/12/16  6:40 PM  Result Value Ref Range   WBC 9.5 4.0 - 10.5 K/uL   RBC 6.22 (H) 4.22 - 5.81 MIL/uL   Hemoglobin 18.8 (H) 13.0 - 17.0 g/dL   HCT 55.0 (H) 39.0 - 52.0 %   MCV 88.4 78.0 - 100.0 fL   MCH 30.2 26.0 - 34.0 pg   MCHC 34.2 30.0 - 36.0 g/dL   RDW 14.0 11.5 - 15.5 %   Platelets 257 150 - 400 K/uL   Neutrophils Relative % 70 %   Lymphocytes Relative 15 %   Monocytes Relative 14 %   Eosinophils Relative 0 %   Basophils Relative 1 %   Neutro Abs 6.7 1.7 - 7.7 K/uL   Lymphs Abs 1.4 0.7 - 4.0 K/uL   Monocytes Absolute 1.3  (H) 0.1 - 1.0 K/uL   Eosinophils Absolute 0.0 0.0 - 0.7 K/uL   Basophils Absolute 0.1 0.0 - 0.1 K/uL   WBC Morphology ATYPICAL LYMPHOCYTES   Comprehensive metabolic panel     Status: Abnormal   Collection Time: 07/12/16  6:40 PM  Result Value Ref Range   Sodium 132 (L) 135 - 145 mmol/L   Potassium 3.6 3.5 - 5.1 mmol/L   Chloride 90 (L) 101 - 111 mmol/L   CO2 19 (L) 22 - 32 mmol/L   Glucose, Bld 226 (H) 65 - 99 mg/dL   BUN 40 (H) 6 - 20 mg/dL   Creatinine, Ser 3.36 (H) 0.61 - 1.24 mg/dL    Comment: DELTA CHECK NOTED   Calcium 8.5 (L) 8.9 - 10.3 mg/dL   Total Protein 7.7 6.5 - 8.1 g/dL   Albumin 3.3 (L) 3.5 - 5.0 g/dL   AST 61 (H) 15 - 41 U/L   ALT <5 (L) 17 - 63 U/L   Alkaline Phosphatase 85 38 - 126 U/L   Total Bilirubin 0.8 0.3 - 1.2 mg/dL   GFR calc non Af Amer 21 (L) >60 mL/min   GFR calc Af Amer 24 (L) >60 mL/min    Comment: (NOTE) The eGFR has been calculated  using the CKD EPI equation. This calculation has not been validated in all clinical situations. eGFR's persistently <60 mL/min signify possible Chronic Kidney Disease.    Anion gap 23 (H) 5 - 15  Lipase, blood     Status: None   Collection Time: 07/12/16  6:40 PM  Result Value Ref Range   Lipase 18 11 - 51 U/L  Lactic acid, plasma     Status: Abnormal   Collection Time: 07/12/16  6:46 PM  Result Value Ref Range   Lactic Acid, Venous 11.3 (HH) 0.5 - 1.9 mmol/L    Comment: RESULTS CONFIRMED BY MANUAL DILUTION CRITICAL RESULT CALLED TO, READ BACK BY AND VERIFIED WITH: SANGELANG B,RN 07/12/16 2129 WAYK   I-Stat Chem 8, ED     Status: Abnormal   Collection Time: 07/12/16  6:51 PM  Result Value Ref Range   Sodium 134 (L) 135 - 145 mmol/L   Potassium 3.7 3.5 - 5.1 mmol/L   Chloride 99 (L) 101 - 111 mmol/L   BUN 42 (H) 6 - 20 mg/dL   Creatinine, Ser 2.80 (H) 0.61 - 1.24 mg/dL   Glucose, Bld 225 (H) 65 - 99 mg/dL   Calcium, Ion 0.94 (L) 1.15 - 1.40 mmol/L   TCO2 19 0 - 100 mmol/L   Hemoglobin 18.0 (H) 13.0 -  17.0 g/dL   HCT 53.0 (H) 39.0 - 52.0 %   Ct Abdomen Pelvis Wo Contrast  Result Date: 07/12/2016 CLINICAL DATA:  45 year old male with pain and swelling of the left testicle radiating into the stomach. Evaluate for distal bowel obstruction. Recent right inguinal hernia repair. EXAM: CT ABDOMEN AND PELVIS WITHOUT CONTRAST TECHNIQUE: Multidetector CT imaging of the abdomen and pelvis was performed following the standard protocol without IV contrast. COMPARISON:  Abdominal CT dated 07/07/2016 FINDINGS: Evaluation of this exam is limited in the absence of intravenous contrast. Lower chest: Minimal left lung base atelectatic changes. The visualized portions of the lungs are otherwise clear. No intra-abdominal free air or free fluid. Hepatobiliary: No focal liver abnormality is seen. No gallstones, gallbladder wall thickening, or biliary dilatation. Pancreas: Unremarkable. No pancreatic ductal dilatation or surrounding inflammatory changes. Spleen: Normal in size without focal abnormality. Adrenals/Urinary Tract: Adrenal glands are unremarkable. Kidneys are normal, without renal calculi, focal lesion, or hydronephrosis. Bladder is unremarkable. Stomach/Bowel: Evaluation of the bowel is limited in the absence of oral contrast. The stomach is distended with fluid content. There is dilatation of fluid-filled loops of small bowel throughout the abdomen with air-fluid level. The small bowel measures up to 4.8 cm in caliber. A transition zone is noted in the right inguinal hernia. The cecum and the appendix remain in the right lower quadrant above the inguinal ligament. The appendix appears unremarkable. Small amount of fluid noted throughout the colon. The colon is otherwise decompressed. Vascular/Lymphatic: The abdominal aorta and IVC appear grossly unremarkable on this noncontrast study. No portal venous gas identified. There is no adenopathy. Reproductive: The prostate and seminal vesicles are grossly unremarkable.  Other: There is a 5.5 cm right inguinal defect with herniation of the distal small bowel and distal ileum into the right inguinal canal. A transition zone is noted in the right inguinal canal. Fluid-filled and mildly dilated loop of small bowel is noted within the right inguinal hernia. There is inflammatory changes with a small amount of fluid in the right inguinal hernia. There is thickening of the scrotal wall with a small hydrocele. There is stranding of the subcutaneous fat adjacent to the  inguinal hernia with a small speck of subcutaneous air likely related to recent surgery. She small pockets of air within the right inguinal hernia appear to be intraluminal. No evidence of pneumatosis. Musculoskeletal: No acute osseous pathology identified. Right iliac bone lesion as seen on the prior studies. IMPRESSION: Recurrent right inguinal hernia defect containing distal ileum resulting in small-bowel obstruction with transition zone in the distal ileum within the right inguinal hernia. The cecum and appendix remain in the right lower quadrant above the inguinal ligament. No evidence of pneumatosis. No portal venous gas. Electronically Signed   By: Anner Crete M.D.   On: 07/12/2016 22:38   Dg Chest Portable 1 View  Result Date: 07/12/2016 CLINICAL DATA:  Right-sided chest and abdominal pain EXAM: PORTABLE CHEST 1 VIEW COMPARISON:  None. FINDINGS: Cardiac shadow is within normal limits. The lungs are well aerated with mild left basilar atelectasis. No focal confluent infiltrate is seen. No effusion or pneumothorax is noted. No bony abnormality is seen. IMPRESSION: Minimal left basilar atelectasis. Electronically Signed   By: Inez Catalina M.D.   On: 07/12/2016 19:12    Review of Systems  Constitutional: Negative for chills and fever.  HENT: Negative for hearing loss.   Eyes: Negative for blurred vision and double vision.  Respiratory: Negative for cough and hemoptysis.   Cardiovascular: Negative for  chest pain and palpitations.  Gastrointestinal: Positive for abdominal pain, diarrhea, nausea and vomiting.  Genitourinary: Negative for dysuria and urgency.  Musculoskeletal: Negative for myalgias and neck pain.  Skin: Negative for itching and rash.  Neurological: Positive for dizziness. Negative for tingling and headaches.  Endo/Heme/Allergies: Does not bruise/bleed easily.  Psychiatric/Behavioral: Negative for depression and suicidal ideas.    Blood pressure 112/65, pulse (!) 144, temperature 99.6 F (37.6 C), temperature source Oral, resp. rate (!) 30, SpO2 100 %. Physical Exam  Vitals reviewed. Constitutional: He is oriented to person, place, and time. He appears well-developed and well-nourished.  HENT:  Head: Normocephalic and atraumatic.  Eyes: Conjunctivae and EOM are normal. Pupils are equal, round, and reactive to light.  Neck: Normal range of motion. Neck supple.  Cardiovascular: Tachycardia present.   Respiratory: Effort normal and breath sounds normal.  GI: Soft. Bowel sounds are normal. He exhibits no distension. There is tenderness. There is guarding.  Musculoskeletal: Normal range of motion.  Neurological: He is alert and oriented to person, place, and time.  Skin: Skin is warm and dry.  Psychiatric: He has a normal mood and affect. His behavior is normal.     Assessment/Plan 45 yo male s/p right inguinal hernia repair with mesh for bowel obstruction now with AKI, lactic acidosis, signs of recurrence of hernia with transition point concerning for bowel obstruction. -broad ABx -NG tube -OR for exploration -likely ICU admission  Mickeal Skinner, MD 07/12/2016, 11:17 PM

## 2016-07-12 NOTE — ED Notes (Signed)
Pt. signed consent form for his hernia repair/laparotomy by Dr. . Sheliah HatchKinsinger.

## 2016-07-12 NOTE — ED Triage Notes (Signed)
Per EMS: Pt complaining of R sided abdominal pain. Pt states recent inguinal hernia surgery here. Pt states multiple stool softeners post surgery. Pt complaining of diarrhea. Pt tachy, 140's upon arrival. Pt states only drinking fluids, denies any solid foods x 16 days. Pt complaining of generalized weakness. EMS states CBG = 182, 110/76 BP, 167bpm. Surgical site intact, no obvious redness or swelling.

## 2016-07-12 NOTE — ED Notes (Signed)
Dr. Fayrene FearingJames notified on elevated lactate acid results.

## 2016-07-12 NOTE — ED Notes (Signed)
EDP notified on pt.'s hypotension .

## 2016-07-12 NOTE — ED Provider Notes (Signed)
Pt seen and evaluated with Dr. Katrinka BlazingSmith.  Pt is s/p Inguinal hernia repair.  Same presentation in 2 days for vomiting. Hydrated yesterday with normal labs. Presents today hypotensive tachycardic. Acute renal insufficiency. Lactic acidosis. Given multiple fluid boluses. Started with 30 mL/kg. His have been given additional liter bolus and since that time. He is making urine. Continues to mentate well. Complains of abdominal discomfort but has a benign abdominal exam and no rigidity. His incision appears well. Discussed with Dr. Drexel IhaKissinger who is here evaluating the patient for admission.  CRITICAL CARE Performed by: Rolland PorterJAMES, Jourdin Gens JOSEPH   Total critical care time: 30 minutes  Critical care time was exclusive of separately billable procedures and treating other patients.  Critical care was necessary to treat or prevent imminent or life-threatening deterioration.  Critical care was time spent personally by me on the following activities: development of treatment plan with patient and/or surrogate as well as nursing, discussions with consultants, evaluation of patient's response to treatment, examination of patient, obtaining history from patient or surrogate, ordering and performing treatments and interventions, ordering and review of laboratory studies, ordering and review of radiographic studies, pulse oximetry and re-evaluation of patient's condition.    Rolland PorterMark Cruzito Standre, MD 07/12/16 [phone]

## 2016-07-12 NOTE — ED Notes (Signed)
Patient transported to CT 

## 2016-07-13 ENCOUNTER — Encounter (HOSPITAL_COMMUNITY): Payer: Self-pay | Admitting: Anesthesiology

## 2016-07-13 ENCOUNTER — Emergency Department (HOSPITAL_COMMUNITY): Payer: Medicaid Other | Admitting: Anesthesiology

## 2016-07-13 ENCOUNTER — Encounter (HOSPITAL_COMMUNITY): Admission: EM | Disposition: A | Discharge status: Home Health | Payer: Self-pay | Source: Home / Self Care

## 2016-07-13 ENCOUNTER — Inpatient Hospital Stay (HOSPITAL_COMMUNITY): Payer: Medicaid Other

## 2016-07-13 DIAGNOSIS — K660 Peritoneal adhesions (postprocedural) (postinfection): Secondary | ICD-10-CM | POA: Diagnosis present

## 2016-07-13 DIAGNOSIS — N179 Acute kidney failure, unspecified: Secondary | ICD-10-CM | POA: Diagnosis present

## 2016-07-13 DIAGNOSIS — E872 Acidosis: Secondary | ICD-10-CM | POA: Diagnosis present

## 2016-07-13 DIAGNOSIS — R6520 Severe sepsis without septic shock: Secondary | ICD-10-CM | POA: Diagnosis not present

## 2016-07-13 DIAGNOSIS — R6521 Severe sepsis with septic shock: Secondary | ICD-10-CM | POA: Diagnosis present

## 2016-07-13 DIAGNOSIS — R066 Hiccough: Secondary | ICD-10-CM | POA: Diagnosis not present

## 2016-07-13 DIAGNOSIS — E669 Obesity, unspecified: Secondary | ICD-10-CM | POA: Diagnosis present

## 2016-07-13 DIAGNOSIS — Z6836 Body mass index (BMI) 36.0-36.9, adult: Secondary | ICD-10-CM | POA: Diagnosis not present

## 2016-07-13 DIAGNOSIS — R739 Hyperglycemia, unspecified: Secondary | ICD-10-CM | POA: Diagnosis not present

## 2016-07-13 DIAGNOSIS — E876 Hypokalemia: Secondary | ICD-10-CM | POA: Diagnosis present

## 2016-07-13 DIAGNOSIS — A419 Sepsis, unspecified organism: Secondary | ICD-10-CM | POA: Diagnosis present

## 2016-07-13 DIAGNOSIS — K56609 Unspecified intestinal obstruction, unspecified as to partial versus complete obstruction: Secondary | ICD-10-CM | POA: Diagnosis present

## 2016-07-13 DIAGNOSIS — J40 Bronchitis, not specified as acute or chronic: Secondary | ICD-10-CM | POA: Diagnosis not present

## 2016-07-13 DIAGNOSIS — K659 Peritonitis, unspecified: Secondary | ICD-10-CM | POA: Diagnosis not present

## 2016-07-13 DIAGNOSIS — E870 Hyperosmolality and hypernatremia: Secondary | ICD-10-CM | POA: Diagnosis not present

## 2016-07-13 DIAGNOSIS — K460 Unspecified abdominal hernia with obstruction, without gangrene: Secondary | ICD-10-CM | POA: Diagnosis not present

## 2016-07-13 DIAGNOSIS — E46 Unspecified protein-calorie malnutrition: Secondary | ICD-10-CM | POA: Diagnosis not present

## 2016-07-13 DIAGNOSIS — E8770 Fluid overload, unspecified: Secondary | ICD-10-CM | POA: Diagnosis not present

## 2016-07-13 DIAGNOSIS — K562 Volvulus: Secondary | ICD-10-CM | POA: Diagnosis not present

## 2016-07-13 DIAGNOSIS — E781 Pure hyperglyceridemia: Secondary | ICD-10-CM | POA: Diagnosis not present

## 2016-07-13 DIAGNOSIS — R000 Tachycardia, unspecified: Secondary | ICD-10-CM | POA: Diagnosis present

## 2016-07-13 DIAGNOSIS — E861 Hypovolemia: Secondary | ICD-10-CM | POA: Diagnosis present

## 2016-07-13 DIAGNOSIS — K4031 Unilateral inguinal hernia, with obstruction, without gangrene, recurrent: Secondary | ICD-10-CM | POA: Diagnosis present

## 2016-07-13 DIAGNOSIS — I959 Hypotension, unspecified: Secondary | ICD-10-CM | POA: Diagnosis not present

## 2016-07-13 DIAGNOSIS — K56699 Other intestinal obstruction unspecified as to partial versus complete obstruction: Secondary | ICD-10-CM | POA: Diagnosis not present

## 2016-07-13 DIAGNOSIS — J9601 Acute respiratory failure with hypoxia: Secondary | ICD-10-CM | POA: Diagnosis not present

## 2016-07-13 DIAGNOSIS — G9341 Metabolic encephalopathy: Secondary | ICD-10-CM | POA: Diagnosis not present

## 2016-07-13 DIAGNOSIS — J9811 Atelectasis: Secondary | ICD-10-CM | POA: Diagnosis not present

## 2016-07-13 DIAGNOSIS — K567 Ileus, unspecified: Secondary | ICD-10-CM | POA: Diagnosis not present

## 2016-07-13 DIAGNOSIS — R579 Shock, unspecified: Secondary | ICD-10-CM | POA: Diagnosis present

## 2016-07-13 HISTORY — PX: LAPAROSCOPIC APPENDECTOMY: SHX408

## 2016-07-13 LAB — MRSA PCR SCREENING: MRSA BY PCR: NEGATIVE

## 2016-07-13 LAB — POCT I-STAT 7, (LYTES, BLD GAS, ICA,H+H)
ACID-BASE EXCESS: 5 mmol/L — AB (ref 0.0–2.0)
Bicarbonate: 29.3 mmol/L — ABNORMAL HIGH (ref 20.0–28.0)
Calcium, Ion: 1.19 mmol/L (ref 1.15–1.40)
HCT: 34 % — ABNORMAL LOW (ref 39.0–52.0)
HEMOGLOBIN: 11.6 g/dL — AB (ref 13.0–17.0)
O2 SAT: 100 %
PO2 ART: 224 mmHg — AB (ref 83.0–108.0)
Patient temperature: 39
Potassium: 3.4 mmol/L — ABNORMAL LOW (ref 3.5–5.1)
Sodium: 147 mmol/L — ABNORMAL HIGH (ref 135–145)
TCO2: 30 mmol/L (ref 0–100)
pCO2 arterial: 42.6 mmHg (ref 32.0–48.0)
pH, Arterial: 7.453 — ABNORMAL HIGH (ref 7.350–7.450)

## 2016-07-13 LAB — CBC
HCT: 42.7 % (ref 39.0–52.0)
HEMOGLOBIN: 14.9 g/dL (ref 13.0–17.0)
MCH: 29.7 pg (ref 26.0–34.0)
MCHC: 34.9 g/dL (ref 30.0–36.0)
MCV: 85.1 fL (ref 78.0–100.0)
PLATELETS: 232 10*3/uL (ref 150–400)
RBC: 5.02 MIL/uL (ref 4.22–5.81)
RDW: 13.8 % (ref 11.5–15.5)
WBC: 10.8 10*3/uL — ABNORMAL HIGH (ref 4.0–10.5)

## 2016-07-13 LAB — POCT I-STAT 3, ART BLOOD GAS (G3+)
ACID-BASE DEFICIT: 1 mmol/L (ref 0.0–2.0)
BICARBONATE: 26.2 mmol/L (ref 20.0–28.0)
O2 Saturation: 95 %
TCO2: 28 mmol/L (ref 0–100)
pCO2 arterial: 51.6 mmHg — ABNORMAL HIGH (ref 32.0–48.0)
pH, Arterial: 7.316 — ABNORMAL LOW (ref 7.350–7.450)
pO2, Arterial: 86 mmHg (ref 83.0–108.0)

## 2016-07-13 LAB — LACTIC ACID, PLASMA
LACTIC ACID, VENOUS: 5.3 mmol/L — AB (ref 0.5–1.9)
LACTIC ACID, VENOUS: 4.5 mmol/L — AB (ref 0.5–1.9)
Lactic Acid, Venous: 8.7 mmol/L (ref 0.5–1.9)

## 2016-07-13 LAB — TRIGLYCERIDES: TRIGLYCERIDES: 61 mg/dL (ref <150)

## 2016-07-13 SURGERY — APPENDECTOMY, LAPAROSCOPIC
Anesthesia: General | Site: Abdomen | Laterality: Right

## 2016-07-13 MED ORDER — SUCCINYLCHOLINE CHLORIDE 20 MG/ML IJ SOLN
INTRAMUSCULAR | Status: DC | PRN
  Administered 2016-07-13: 120 mg via INTRAVENOUS

## 2016-07-13 MED ORDER — SODIUM BICARBONATE 8.4 % IV SOLN
INTRAVENOUS | Status: DC | PRN
  Administered 2016-07-13 (×2): 50 meq via INTRAVENOUS

## 2016-07-13 MED ORDER — FENTANYL CITRATE (PF) 100 MCG/2ML IJ SOLN
INTRAMUSCULAR | Status: AC
  Filled 2016-07-13: qty 2

## 2016-07-13 MED ORDER — PROPOFOL 1000 MG/100ML IV EMUL
0.0000 ug/kg/min | INTRAVENOUS | Status: DC
  Administered 2016-07-13: 25 ug/kg/min via INTRAVENOUS
  Administered 2016-07-14: 5 ug/kg/min via INTRAVENOUS
  Administered 2016-07-14 – 2016-07-16 (×5): 15 ug/kg/min via INTRAVENOUS
  Filled 2016-07-13 (×8): qty 100

## 2016-07-13 MED ORDER — EPINEPHRINE PF 1 MG/10ML IJ SOSY
PREFILLED_SYRINGE | INTRAMUSCULAR | Status: AC
  Filled 2016-07-13: qty 10

## 2016-07-13 MED ORDER — CALCIUM CHLORIDE 10 % IV SOLN
INTRAVENOUS | Status: DC | PRN
  Administered 2016-07-13 (×3): 100 mg via INTRAVENOUS

## 2016-07-13 MED ORDER — FENTANYL BOLUS VIA INFUSION
50.0000 ug | INTRAVENOUS | Status: DC | PRN
  Administered 2016-07-13 – 2016-07-17 (×13): 50 ug via INTRAVENOUS
  Filled 2016-07-13: qty 50

## 2016-07-13 MED ORDER — ROCURONIUM BROMIDE 50 MG/5ML IV SOSY
PREFILLED_SYRINGE | INTRAVENOUS | Status: AC
  Filled 2016-07-13: qty 10

## 2016-07-13 MED ORDER — SODIUM BICARBONATE 8.4 % IV SOLN
INTRAVENOUS | Status: AC
  Filled 2016-07-13: qty 50

## 2016-07-13 MED ORDER — ALBUMIN HUMAN 5 % IV SOLN
25.0000 g | Freq: Once | INTRAVENOUS | Status: AC
  Administered 2016-07-13: 25 g via INTRAVENOUS
  Filled 2016-07-13: qty 250

## 2016-07-13 MED ORDER — VASOPRESSIN 20 UNIT/ML IV SOLN
0.0300 [IU]/min | INTRAVENOUS | Status: DC
  Administered 2016-07-13 – 2016-07-18 (×7): 0.03 [IU]/min via INTRAVENOUS
  Filled 2016-07-13 (×7): qty 2

## 2016-07-13 MED ORDER — BUPIVACAINE-EPINEPHRINE (PF) 0.25% -1:200000 IJ SOLN
INTRAMUSCULAR | Status: AC
  Filled 2016-07-13: qty 30

## 2016-07-13 MED ORDER — LACTATED RINGERS IV SOLN
INTRAVENOUS | Status: DC | PRN
  Administered 2016-07-13 (×2): via INTRAVENOUS

## 2016-07-13 MED ORDER — FENTANYL CITRATE (PF) 250 MCG/5ML IJ SOLN
INTRAMUSCULAR | Status: DC | PRN
  Administered 2016-07-13 (×2): 100 ug via INTRAVENOUS

## 2016-07-13 MED ORDER — KCL IN DEXTROSE-NACL 20-5-0.45 MEQ/L-%-% IV SOLN
INTRAVENOUS | Status: DC
  Administered 2016-07-13: 14:00:00 via INTRAVENOUS
  Administered 2016-07-13: 75 mL/h via INTRAVENOUS
  Administered 2016-07-14 (×2): via INTRAVENOUS
  Administered 2016-07-15 (×2): 75 mL/h via INTRAVENOUS
  Administered 2016-07-16: 09:00:00 via INTRAVENOUS
  Filled 2016-07-13 (×8): qty 1000

## 2016-07-13 MED ORDER — MIDAZOLAM HCL 5 MG/5ML IJ SOLN
INTRAMUSCULAR | Status: DC | PRN
  Administered 2016-07-13 (×2): 2 mg via INTRAVENOUS

## 2016-07-13 MED ORDER — PROPOFOL 10 MG/ML IV BOLUS
INTRAVENOUS | Status: AC
Start: 2016-07-13 — End: 2016-07-13
  Filled 2016-07-13: qty 20

## 2016-07-13 MED ORDER — LIDOCAINE 2% (20 MG/ML) 5 ML SYRINGE
INTRAMUSCULAR | Status: AC
  Filled 2016-07-13: qty 5

## 2016-07-13 MED ORDER — PHENYLEPHRINE HCL 10 MG/ML IJ SOLN
INTRAMUSCULAR | Status: AC
  Filled 2016-07-13: qty 1

## 2016-07-13 MED ORDER — FENTANYL CITRATE (PF) 100 MCG/2ML IJ SOLN
50.0000 ug | Freq: Once | INTRAMUSCULAR | Status: AC
  Administered 2016-07-13: 50 ug via INTRAVENOUS
  Filled 2016-07-13: qty 2

## 2016-07-13 MED ORDER — ORAL CARE MOUTH RINSE
15.0000 mL | OROMUCOSAL | Status: DC
  Administered 2016-07-13 – 2016-07-18 (×52): 15 mL via OROMUCOSAL

## 2016-07-13 MED ORDER — NOREPINEPHRINE BITARTRATE 1 MG/ML IV SOLN
0.0000 ug/min | INTRAVENOUS | Status: DC
  Administered 2016-07-13: 2 ug/min via INTRAVENOUS
  Administered 2016-07-13: 12 ug/min via INTRAVENOUS
  Administered 2016-07-13: 15 ug/min via INTRAVENOUS
  Administered 2016-07-14 (×2): 13 ug/min via INTRAVENOUS
  Administered 2016-07-14: 25 ug/min via INTRAVENOUS
  Filled 2016-07-13 (×7): qty 4

## 2016-07-13 MED ORDER — MIDAZOLAM HCL 2 MG/2ML IJ SOLN
INTRAMUSCULAR | Status: AC
  Filled 2016-07-13: qty 2

## 2016-07-13 MED ORDER — SODIUM CHLORIDE 0.9 % IV BOLUS (SEPSIS)
500.0000 mL | Freq: Two times a day (BID) | INTRAVENOUS | Status: DC | PRN
  Administered 2016-07-13 (×2): 500 mL via INTRAVENOUS
  Filled 2016-07-13 (×2): qty 500

## 2016-07-13 MED ORDER — PHENYLEPHRINE 40 MCG/ML (10ML) SYRINGE FOR IV PUSH (FOR BLOOD PRESSURE SUPPORT)
PREFILLED_SYRINGE | INTRAVENOUS | Status: AC
  Filled 2016-07-13: qty 10

## 2016-07-13 MED ORDER — SODIUM CHLORIDE 0.9 % IV SOLN
25.0000 ug/h | INTRAVENOUS | Status: DC
  Administered 2016-07-13: 50 ug/h via INTRAVENOUS
  Administered 2016-07-13: 100 ug/h via INTRAVENOUS
  Administered 2016-07-14 – 2016-07-15 (×5): 400 ug/h via INTRAVENOUS
  Administered 2016-07-15: 40 ug/h via INTRAVENOUS
  Administered 2016-07-15 (×2): 400 ug/h via INTRAVENOUS
  Administered 2016-07-15: 300 ug/h via INTRAVENOUS
  Administered 2016-07-16 – 2016-07-18 (×11): 400 ug/h via INTRAVENOUS
  Filled 2016-07-13 (×22): qty 50

## 2016-07-13 MED ORDER — 0.9 % SODIUM CHLORIDE (POUR BTL) OPTIME
TOPICAL | Status: DC | PRN
  Administered 2016-07-13 (×4): 1000 mL

## 2016-07-13 MED ORDER — CALCIUM CHLORIDE 10 % IV SOLN
INTRAVENOUS | Status: AC
  Filled 2016-07-13: qty 10

## 2016-07-13 MED ORDER — ALBUMIN HUMAN 5 % IV SOLN
INTRAVENOUS | Status: DC | PRN
  Administered 2016-07-13 (×3): via INTRAVENOUS

## 2016-07-13 MED ORDER — PIPERACILLIN-TAZOBACTAM 3.375 G IVPB
3.3750 g | Freq: Three times a day (TID) | INTRAVENOUS | Status: AC
  Administered 2016-07-13 – 2016-07-22 (×30): 3.375 g via INTRAVENOUS
  Filled 2016-07-13 (×30): qty 50

## 2016-07-13 MED ORDER — HEPARIN SODIUM (PORCINE) 5000 UNIT/ML IJ SOLN
5000.0000 [IU] | Freq: Three times a day (TID) | INTRAMUSCULAR | Status: DC
  Administered 2016-07-13 – 2016-07-28 (×44): 5000 [IU] via SUBCUTANEOUS
  Filled 2016-07-13 (×44): qty 1

## 2016-07-13 MED ORDER — POTASSIUM CHLORIDE 20 MEQ/15ML (10%) PO SOLN: 20.0000 meq | Freq: Once | ORAL | Status: DC

## 2016-07-13 MED ORDER — ROCURONIUM BROMIDE 100 MG/10ML IV SOLN
INTRAVENOUS | Status: DC | PRN
  Administered 2016-07-13: 15 mg via INTRAVENOUS
  Administered 2016-07-13: 30 mg via INTRAVENOUS
  Administered 2016-07-13: 35 mg via INTRAVENOUS

## 2016-07-13 MED ORDER — SUCCINYLCHOLINE CHLORIDE 200 MG/10ML IV SOSY
PREFILLED_SYRINGE | INTRAVENOUS | Status: AC
  Filled 2016-07-13: qty 10

## 2016-07-13 MED ORDER — PROPOFOL 10 MG/ML IV BOLUS
INTRAVENOUS | Status: DC | PRN
  Administered 2016-07-13: 180 mg via INTRAVENOUS

## 2016-07-13 MED ORDER — CHLORHEXIDINE GLUCONATE 0.12% ORAL RINSE (MEDLINE KIT)
15.0000 mL | Freq: Two times a day (BID) | OROMUCOSAL | Status: DC
  Administered 2016-07-13 – 2016-07-18 (×13): 15 mL via OROMUCOSAL

## 2016-07-13 MED ORDER — PHENYLEPHRINE HCL 10 MG/ML IJ SOLN
INTRAVENOUS | Status: DC | PRN
  Administered 2016-07-13: 25 ug/min via INTRAVENOUS

## 2016-07-13 MED ORDER — VASOPRESSIN 20 UNIT/ML IV SOLN
INTRAVENOUS | Status: DC | PRN
  Administered 2016-07-13 (×2): 2 [IU] via INTRAVENOUS
  Administered 2016-07-13: 4 [IU] via INTRAVENOUS
  Administered 2016-07-13 (×4): 2 [IU] via INTRAVENOUS

## 2016-07-13 MED ORDER — EPHEDRINE 5 MG/ML INJ
INTRAVENOUS | Status: AC
  Filled 2016-07-13: qty 10

## 2016-07-13 MED ORDER — LIDOCAINE HCL (CARDIAC) 20 MG/ML IV SOLN
INTRAVENOUS | Status: DC | PRN
  Administered 2016-07-13: 60 mg via INTRATRACHEAL

## 2016-07-13 SURGICAL SUPPLY — 54 items
ADH SKN CLS APL DERMABOND .7 (GAUZE/BANDAGES/DRESSINGS)
APPLIER CLIP 5 13 M/L LIGAMAX5 (MISCELLANEOUS)
APR CLP MED LRG 5 ANG JAW (MISCELLANEOUS)
BAG SPEC RTRVL 10 TROC 200 (ENDOMECHANICALS)
CANISTER SUCTION 2500CC (MISCELLANEOUS) ×3 IMPLANT
CANISTER WOUND CARE 500ML ATS (WOUND CARE) ×2 IMPLANT
CELLS DAT CNTRL 66122 CELL SVR (MISCELLANEOUS) ×1 IMPLANT
CHLORAPREP W/TINT 26ML (MISCELLANEOUS) ×5 IMPLANT
CLIP APPLIE 5 13 M/L LIGAMAX5 (MISCELLANEOUS) IMPLANT
COVER SURGICAL LIGHT HANDLE (MISCELLANEOUS) ×3 IMPLANT
DERMABOND ADVANCED (GAUZE/BANDAGES/DRESSINGS)
DERMABOND ADVANCED .7 DNX12 (GAUZE/BANDAGES/DRESSINGS) ×1 IMPLANT
DRSG VAC ATS LRG SENSATRAC (GAUZE/BANDAGES/DRESSINGS) ×2 IMPLANT
ELECT BLADE 4.0 EZ CLEAN MEGAD (MISCELLANEOUS) ×3
ELECT REM PT RETURN 9FT ADLT (ELECTROSURGICAL) ×3
ELECTRODE BLDE 4.0 EZ CLN MEGD (MISCELLANEOUS) IMPLANT
ELECTRODE REM PT RTRN 9FT ADLT (ELECTROSURGICAL) ×1 IMPLANT
ENDOLOOP SUT PDS II  0 18 (SUTURE) ×2
ENDOLOOP SUT PDS II 0 18 (SUTURE) ×1 IMPLANT
GLOVE BIOGEL PI IND STRL 7.0 (GLOVE) ×1 IMPLANT
GLOVE BIOGEL PI INDICATOR 7.0 (GLOVE) ×2
GLOVE SURG SS PI 7.0 STRL IVOR (GLOVE) ×7 IMPLANT
GOWN STRL REUS W/ TWL LRG LVL3 (GOWN DISPOSABLE) ×3 IMPLANT
GOWN STRL REUS W/TWL LRG LVL3 (GOWN DISPOSABLE) ×9
HANDLE SUCTION POOLE (INSTRUMENTS) IMPLANT
KIT BASIN OR (CUSTOM PROCEDURE TRAY) ×3 IMPLANT
KIT ROOM TURNOVER OR (KITS) ×3 IMPLANT
LIGASURE IMPACT 36 18CM CVD LR (INSTRUMENTS) ×2 IMPLANT
NS IRRIG 1000ML POUR BTL (IV SOLUTION) ×9 IMPLANT
PAD ARMBOARD 7.5X6 YLW CONV (MISCELLANEOUS) ×6 IMPLANT
POUCH RETRIEVAL ECOSAC 10 (ENDOMECHANICALS) ×1 IMPLANT
POUCH RETRIEVAL ECOSAC 10MM (ENDOMECHANICALS)
RELOAD PROXIMATE 75MM BLUE (ENDOMECHANICALS) ×3 IMPLANT
RELOAD STAPLE 75 3.8 BLU REG (ENDOMECHANICALS) IMPLANT
RETRACTOR WND ALEXIS 18 MED (MISCELLANEOUS) IMPLANT
RTRCTR WOUND ALEXIS 18CM MED (MISCELLANEOUS) ×3
SCISSORS LAP 5X35 DISP (ENDOMECHANICALS) ×1 IMPLANT
SET IRRIG TUBING LAPAROSCOPIC (IRRIGATION / IRRIGATOR) ×1 IMPLANT
SLEEVE ENDOPATH XCEL 5M (ENDOMECHANICALS) ×1 IMPLANT
SPECIMEN JAR SMALL (MISCELLANEOUS) ×1 IMPLANT
SPONGE ABDOMINAL VAC ABTHERA (MISCELLANEOUS) ×2 IMPLANT
SPONGE LAP 18X18 X RAY DECT (DISPOSABLE) ×2 IMPLANT
STAPLER PROXIMATE 75MM BLUE (STAPLE) ×2 IMPLANT
SUCTION POOLE HANDLE (INSTRUMENTS) ×3
SUT MNCRL AB 4-0 PS2 18 (SUTURE) ×3 IMPLANT
SUT SILK 3 0SH CR/8 30 (SUTURE) ×2 IMPLANT
SYR BULB IRRIGATION 50ML (SYRINGE) ×4 IMPLANT
TOWEL OR 17X24 6PK STRL BLUE (TOWEL DISPOSABLE) ×3 IMPLANT
TOWEL OR 17X26 10 PK STRL BLUE (TOWEL DISPOSABLE) ×3 IMPLANT
TRAY FOLEY CATH 16FR SILVER (SET/KITS/TRAYS/PACK) ×2 IMPLANT
TRAY LAPAROSCOPIC MC (CUSTOM PROCEDURE TRAY) ×3 IMPLANT
TROCAR XCEL NON-BLD 11X100MML (ENDOMECHANICALS) ×1 IMPLANT
TROCAR XCEL NON-BLD 5MMX100MML (ENDOMECHANICALS) ×1 IMPLANT
TUBING INSUFFLATION (TUBING) ×1 IMPLANT

## 2016-07-13 NOTE — ED Notes (Signed)
Pt transported to OR in stable condition.

## 2016-07-13 NOTE — Progress Notes (Signed)
Initial Nutrition Assessment  DOCUMENTATION CODES:   Obesity unspecified  INTERVENTION:    Recommend nutrition support initiation in next 24-48 hours (EN vs TPN)  NUTRITION DIAGNOSIS:   Inadequate oral intake related to inability to eat as evidenced by NPO status  GOAL:   Provide needs based on ASPEN/SCCM guidelines  MONITOR:   Vent status, Labs, Weight trends, Skin, I & O's  REASON FOR ASSESSMENT:   Ventilator  ASSESSMENT:   45 y.o. Male with symptoms of nausea vomiting abdominal pain with diarrhea. He presented with acute kidney insufficiency as well as lactic acidosis on CT scanning showed multiple dilated loops of bowel and recurrent right inguinal hernia  Pt s/p procedures 12/11: EXPLORATION OF RIGHT GROIN STRANGULATION OF TERMINAL ILEUM BOWEL OBSTRUCTION  Patient is currently intubated on ventilator support >> NGT to LIS Temp (24hrs), Avg:99.6 F (37.6 C), Min:98.4 F (36.9 C), Max:100.9 F (38.3 C)  Pt underwent R open inguinal hernia for bowel obstruction 12/5. Re-admitted with nausea, abdominal pain with diarrhea. Awake on vent and on pressors to maintain BP. Labs and medications reviewed.  Diet Order:  Diet NPO time specified  Skin:  Wound (see comment) (abdominal wound VAC)  Last BM:  PTA  Height:   Ht Readings from Last 1 Encounters:  07/13/16 5\' 11"  (1.803 m)    Weight:   Wt Readings from Last 1 Encounters:  07/13/16 257 lb 8 oz (116.8 kg)    Ideal Body Weight:  78.1 kg  BMI:  Body mass index is 35.91 kg/m.  Estimated Nutritional Needs:   Kcal:  [id number]  Protein:  155-165 gm  Fluid:  per MD  EDUCATION NEEDS:   No education needs identified at this time  Maureen ChattersKatie Aleiyah Halpin, RD, LDN Pager #: [phone] After-Hours Pager #: [phone]

## 2016-07-13 NOTE — Progress Notes (Signed)
CRITICAL VALUE ALERT  Critical value received:  Lactic acid = 5.3  Date of notification:  t  Time of notification:  [id number]  Critical value read back:Yes.    Nurse who received alert:  Almedia BallsJ. Bianco Cange RN  MD notified (1st page): CCS,MD  Time of first page:   MD notified (2nd page):  Time of second page:  Responding MD: CCS,MD  Time MD responded:  Result consistent with previous results, MD notified during A.M. rounds

## 2016-07-13 NOTE — Progress Notes (Signed)
Inpatient Diabetes Program Recommendations  AACE/ADA: New Consensus Statement on Inpatient Glycemic Control (2015)  Target Ranges:  Prepandial:   less than 140 mg/dL      Peak postprandial:   less than 180 mg/dL (1-2 hours)      Critically ill patients:  140 - 180 mg/dL    Review of Glycemic ControlResults for Travis Gould, Travis Gould (MRN [id number]) as of 07/13/2016 10:28  Ref. Range 07/11/2016 14:03 07/12/2016 18:40 07/12/2016 18:51  Glucose Latest Ref Range: 65 - 99 mg/dL 409152 (H) 811226 (H) 914225 (H)   Diabetes history: None Outpatient Diabetes medications: None Current orders for Inpatient glycemic control: None  Inpatient Diabetes Program Recommendations:   Please start ICU glycemic control order set-Phase 1.    Thanks, Beryl MeagerJenny Anajah Sterbenz, RN, BC-ADM Inpatient Diabetes Coordinator Pager [phone] (8a-5p)

## 2016-07-13 NOTE — Anesthesia Postprocedure Evaluation (Signed)
Anesthesia Post Note  Patient: Travis Gould  Procedure(s) Performed: Procedure(s) (LRB): INCARCERATED INGUINAL HERNIORRHAPHY, partial colectomy  (Right)  Patient location during evaluation: SICU Anesthesia Type: General Level of consciousness: sedated Pain management: pain level controlled Vital Signs Assessment: post-procedure vital signs reviewed and stable Respiratory status: patient remains intubated per anesthesia plan Cardiovascular status: stable Anesthetic complications: no    Last Vitals:  Vitals:   07/12/16 2245 07/12/16 2300  BP: (!) 109/34 112/65  Pulse: (!) 145 (!) 144  Resp: (!) 33 (!) 30  Temp:      Last Pain:  Vitals:   07/12/16 1841  TempSrc: Oral  PainSc:                  Kamri Gotsch S

## 2016-07-13 NOTE — Progress Notes (Signed)
Patient is awake on the ventilator.  Still on pressors to maintain BP.  FIO2 100%.  Only has two mall gauge IVs.  Needs central line.  Will get that today.  Marta LamasJames O. Gae BonWyatt, III, MD, FACS [phone]--pager [phone]--office [facility]

## 2016-07-13 NOTE — Anesthesia Preprocedure Evaluation (Addendum)
Anesthesia Evaluation  Patient identified by MRN, date of birth, ID band Patient awake  General Assessment Comment:Septic  Hypotensive  Reviewed: Allergy & Precautions, NPO status , Patient's Chart, lab work & pertinent test results  Airway Mallampati: II  TM Distance: >3 FB Neck ROM: Full    Dental no notable dental hx.    Pulmonary neg pulmonary ROS,    Pulmonary exam normal breath sounds clear to auscultation       Cardiovascular negative cardio ROS Normal cardiovascular exam Rhythm:Regular Rate:Normal     Neuro/Psych negative neurological ROS  negative psych ROS   GI/Hepatic negative GI ROS, Neg liver ROS,   Endo/Other  obesity  Renal/GU negative Renal ROS  negative genitourinary   Musculoskeletal negative musculoskeletal ROS (+)   Abdominal   Peds negative pediatric ROS (+)  Hematology negative hematology ROS (+)   Anesthesia Other Findings   Reproductive/Obstetrics negative OB ROS                            Anesthesia Physical Anesthesia Plan  ASA: III and emergent  Anesthesia Plan: General   Post-op Pain Management:    Induction: Intravenous and Rapid sequence  Airway Management Planned: Oral ETT  Additional Equipment:   Intra-op Plan:   Post-operative Plan: Possible Post-op intubation/ventilation  Informed Consent: I have reviewed the patients History and Physical, chart, labs and discussed the procedure including the risks, benefits and alternatives for the proposed anesthesia with the patient or authorized representative who has indicated his/her understanding and acceptance.   Dental advisory given  Plan Discussed with: CRNA and Surgeon  Anesthesia Plan Comments:        Anesthesia Quick Evaluation

## 2016-07-13 NOTE — Progress Notes (Signed)
Pt admitted from OR s/p expl. lap. via hospital bed w/ O2, monitor and CRNA; Pt attached to unit monitors and ventilator; report received from CRNA Lurena Joinerebecca, MRSA swab done and 6 cloth CHG bath complete.Dr Sheliah HatchKinsinger at bedside. Will continue to monitor and assess.

## 2016-07-13 NOTE — Anesthesia Preprocedure Evaluation (Addendum)
Anesthesia Evaluation  Patient identified by MRN, date of birth, ID band Patient awake    Reviewed: Allergy & Precautions, NPO status , Patient's Chart, lab work & pertinent test results  Airway Mallampati: I  TM Distance: >3 FB Neck ROM: Full    Dental no notable dental hx. (+) Teeth Intact, Dental Advisory Given   Pulmonary    Pulmonary exam normal breath sounds clear to auscultation       Cardiovascular Normal cardiovascular exam Rhythm:Regular Rate:Normal     Neuro/Psych    GI/Hepatic   Endo/Other    Renal/GU      Musculoskeletal   Abdominal   Peds  Hematology   Anesthesia Other Findings   Reproductive/Obstetrics                            Anesthesia Physical Anesthesia Plan  ASA: III and emergent  Anesthesia Plan: General   Post-op Pain Management:    Induction: Intravenous, Rapid sequence and Cricoid pressure planned  Airway Management Planned: Oral ETT  Additional Equipment:   Intra-op Plan:   Post-operative Plan: Possible Post-op intubation/ventilation  Informed Consent:   Dental advisory given  Plan Discussed with: CRNA, Anesthesiologist and Surgeon  Anesthesia Plan Comments:        Anesthesia Quick Evaluation

## 2016-07-13 NOTE — Anesthesia Procedure Notes (Signed)
Procedure Name: Intubation Date/Time: 07/13/2016 12:55 AM Performed by: Brien MatesMAHONY, Myriah Boggus D Pre-anesthesia Checklist: Patient identified, Emergency Drugs available, Suction available, Patient being monitored and Timeout performed Patient Re-evaluated:Patient Re-evaluated prior to inductionOxygen Delivery Method: Circle system utilized Preoxygenation: Pre-oxygenation with 100% oxygen Intubation Type: IV induction, Rapid sequence and Cricoid Pressure applied Laryngoscope Size: Miller and 2 Grade View: Grade I Tube type: Oral Tube size: 7.5 mm Number of attempts: 1 Airway Equipment and Method: Stylet Placement Confirmation: positive ETCO2,  ETT inserted through vocal cords under direct vision and breath sounds checked- equal and bilateral Secured at: 23 cm Tube secured with: Tape Dental Injury: Teeth and Oropharynx as per pre-operative assessment

## 2016-07-13 NOTE — Op Note (Signed)
Preoperative diagnosis: Small bowel obstruction  Postoperative diagnosis: Current right inguinal hernia, strangulation of terminal ileum, bowel obstruction  Procedure: exploration of right groin, reduction of right inguinal hernia, resection of small bowel, placement of 200cm^2 negative pressure dressing Surgeon: Feliciana RossettiLuke Kinsinger, M.D.  Asst: none  Anesthesia: gen  Indications for procedure: Travis Gould is a 45 y.o. year old male with symptoms of nausea vomiting abdominal pain with diarrhea. He presented with acute kidney insufficiency as well as lactic acidosis on CT scanning showed multiple dilated loops of bowel and recurrent right inguinal hernia. NG tube placed with 3 L out. Due to concern of lactic acidosis in the face of bowel obstruction he was brought emergently to the operating room for exploration  Description of procedure: The patient was brought into the operative suite. Anesthesia was administered with General endotracheal anesthesia. WHO checklist was applied. The patient was then placed in supine position. The area was prepped and draped in the usual sterile fashion.  Next the right groin incision was reopened the small bowel was immediately present red and dilated with multiple ischemic bands visible. It was adhered to the middle of the mesh likely the spermatic cord defect. Using sharp dissection was freed from the mesh and the mesh was excised entirely and sent to pathology. We attempted to pull more small bowel into the wound however it also appeared dilated and angry therefore it was decided that the best course of action was perform laparotomy to further evaluate cause of this bowel obstruction possible ischemic event.  Therefore, midline laparotomy was made and cautery used to dissect down through the subcutaneous tissues and midline fascia was entered. There is a single Ethibond stitch at the umbilicus and some omental adhesions to the abdominal wall laser taking down with  cautery. Looking at the intestine it was all erythematous and dilated however there are no other adhesions mesentery was not kinked. Attention was turned towards the intestine in the right groin which was able to be reduced. Once reduced it did appear that there is a fixated point on the terminal ileum to the peritoneum of the retroperitoneum. This is mobilized with cautery ensuring not to hit any major posterior structures. Once this was done there was a clear band on the terminal ileum and the 2 feet proximal had multiple areas that appeared full-thickness nonsurvivable. Therefore resection from the ileocecal valve to approximately 2 feet proximal to that was performed using 275 mm blue load GIA staplers and using a LigaSure to take the mesentery. 3-0 silk ties were used to ensure hemostasis of the mesentery.  At this time because the patient was on multiple pressors in critical condition and because the color of the bowel needed a second look, decision was made to perform a temporary abdominal closure with Apthera dressing. Apthera was put in place and the midline standard fashion, then attention was turned to the right groin one layer of vacuum dressing was packed and then a bridge was created between the 2. Vacuum was started and appeared to be intact with minimal leak. Patient was then brought to intensive care unit on pressors with plan for return operating room the next 72 hours.  Findings: ischemic changes of terminal ileum  Specimen: terminal ileum, right groin mesh  Implant: abtherra dressing   Blood loss: <14800ml  Local anesthesia: none  Complications: none  Feliciana RossettiLuke Kinsinger, M.D. General, Bariatric, & Minimally Invasive Surgery [facility]

## 2016-07-13 NOTE — Procedures (Signed)
Central Venous Catheter Insertion Procedure Note Travis Gould [id number] 07/18/1971  Procedure: Insertion of Central Venous Catheter Indications: Assessment of intravascular volume and Drug and/or fluid administration  Procedure Details Consent: Unable to obtain consent because of altered level of consciousness. Time Out: Verified patient identification, verified procedure, site/side was marked, verified correct patient position, special equipment/implants available, medications/allergies/relevent history reviewed, required imaging and test results available.  Performed  Maximum sterile technique was used including antiseptics, cap, gloves, gown, hand hygiene, mask and sheet. Skin prep: Chlorhexidine; local anesthetic administered A antimicrobial bonded/coated triple lumen catheter was placed in the left subclavian vein using the Seldinger technique.  Evaluation Blood flow good Complications: No apparent complications Patient did tolerate procedure well. Chest X-ray ordered to verify placement.  CXR: pending.  Travis Gould 07/13/2016, 12:39 PM

## 2016-07-13 NOTE — Transfer of Care (Signed)
Immediate Anesthesia Transfer of Care Note  Patient: Travis Gould  Procedure(s) Performed: Procedure(s): INCARCERATED INGUINAL HERNIORRHAPHY, partial colectomy  (Right)  Patient Location: SICU  Anesthesia Type:General  Level of Consciousness: sedated  Airway & Oxygen Therapy: Patient remains intubated per anesthesia plan and Patient placed on Ventilator (see vital sign flow sheet for setting)  Post-op Assessment: Report given to RN and Post -op Vital signs reviewed and stable  Post vital signs: Reviewed and stable  Last Vitals:  Vitals:   07/12/16 2245 07/12/16 2300  BP: (!) 109/34 112/65  Pulse: (!) 145 (!) 144  Resp: (!) 33 (!) 30  Temp:      Last Pain:  Vitals:   07/12/16 1841  TempSrc: Oral  PainSc:          Complications: No apparent anesthesia complications

## 2016-07-13 NOTE — ED Notes (Signed)
Report given Product managerebecca CRNA .

## 2016-07-14 ENCOUNTER — Encounter (HOSPITAL_COMMUNITY): Payer: Self-pay | Admitting: General Surgery

## 2016-07-14 ENCOUNTER — Inpatient Hospital Stay (HOSPITAL_COMMUNITY): Payer: Medicaid Other

## 2016-07-14 DIAGNOSIS — K4091 Unilateral inguinal hernia, without obstruction or gangrene, recurrent: Secondary | ICD-10-CM

## 2016-07-14 DIAGNOSIS — K56699 Other intestinal obstruction unspecified as to partial versus complete obstruction: Secondary | ICD-10-CM

## 2016-07-14 DIAGNOSIS — I959 Hypotension, unspecified: Secondary | ICD-10-CM

## 2016-07-14 DIAGNOSIS — N179 Acute kidney failure, unspecified: Secondary | ICD-10-CM

## 2016-07-14 LAB — BASIC METABOLIC PANEL
ANION GAP: 11 (ref 5–15)
BUN: 40 mg/dL — ABNORMAL HIGH (ref 6–20)
CALCIUM: 6.8 mg/dL — AB (ref 8.9–10.3)
CHLORIDE: 103 mmol/L (ref 101–111)
CO2: 22 mmol/L (ref 22–32)
Creatinine, Ser: 1.9 mg/dL — ABNORMAL HIGH (ref 0.61–1.24)
GFR calc non Af Amer: 41 mL/min — ABNORMAL LOW (ref >60)
GFR, EST AFRICAN AMERICAN: 48 mL/min — AB (ref >60)
GLUCOSE: 124 mg/dL — AB (ref 65–99)
Potassium: 4.2 mmol/L (ref 3.5–5.1)
Sodium: 136 mmol/L (ref 135–145)

## 2016-07-14 LAB — POCT I-STAT 3, ART BLOOD GAS (G3+)
Acid-base deficit: 2 mmol/L (ref 0.0–2.0)
Acid-base deficit: 1 mmol/L (ref 0.0–2.0)
Bicarbonate: 23.9 mmol/L (ref 20.0–28.0)
Bicarbonate: 22.8 mmol/L (ref 20.0–28.0)
O2 Saturation: 99 %
O2 Saturation: 99 %
Patient temperature: 102.2
Patient temperature: 102.2
TCO2: 25 mmol/L (ref 0–100)
TCO2: 24 mmol/L (ref 0–100)
pCO2 arterial: 48.2 mmHg — ABNORMAL HIGH (ref 32.0–48.0)
pCO2 arterial: 36.2 mmHg (ref 32.0–48.0)
pH, Arterial: 7.313 — ABNORMAL LOW (ref 7.350–7.450)
pH, Arterial: 7.414 (ref 7.350–7.450)
pO2, Arterial: 174 mmHg — ABNORMAL HIGH (ref 83.0–108.0)
pO2, Arterial: 168 mmHg — ABNORMAL HIGH (ref 83.0–108.0)

## 2016-07-14 LAB — URINALYSIS, ROUTINE W REFLEX MICROSCOPIC
BILIRUBIN URINE: NEGATIVE
Bacteria, UA: NONE SEEN
GLUCOSE, UA: NEGATIVE mg/dL
Ketones, ur: NEGATIVE mg/dL
Leukocytes, UA: NEGATIVE
NITRITE: NEGATIVE
PH: 5 (ref 5.0–8.0)
Protein, ur: 100 mg/dL — AB
SPECIFIC GRAVITY, URINE: 1.034 — AB (ref 1.005–1.030)

## 2016-07-14 LAB — CBC
HEMATOCRIT: 44.2 % (ref 39.0–52.0)
Hemoglobin: 14.9 g/dL (ref 13.0–17.0)
MCH: 29.4 pg (ref 26.0–34.0)
MCHC: 33.7 g/dL (ref 30.0–36.0)
MCV: 87.4 fL (ref 78.0–100.0)
Platelets: 210 10*3/uL (ref 150–400)
RBC: 5.06 MIL/uL (ref 4.22–5.81)
RDW: 14.5 % (ref 11.5–15.5)
WBC: 13.4 10*3/uL — AB (ref 4.0–10.5)

## 2016-07-14 LAB — PROTIME-INR
INR: 1.48
PROTHROMBIN TIME: 18 s — AB (ref 11.4–15.2)

## 2016-07-14 LAB — LACTIC ACID, PLASMA: Lactic Acid, Venous: 3.6 mmol/L (ref 0.5–1.9)

## 2016-07-14 MED ORDER — DEXTROSE 5 % IV SOLN
0.0000 ug/min | INTRAVENOUS | Status: DC
  Administered 2016-07-14: 30 ug/min via INTRAVENOUS
  Administered 2016-07-15: 22 ug/min via INTRAVENOUS
  Administered 2016-07-15: 35 ug/min via INTRAVENOUS
  Administered 2016-07-17: 12 ug/min via INTRAVENOUS
  Filled 2016-07-14 (×7): qty 16

## 2016-07-14 MED ORDER — ACETAMINOPHEN 10 MG/ML IV SOLN
1000.0000 mg | Freq: Four times a day (QID) | INTRAVENOUS | Status: AC | PRN
  Administered 2016-07-15: 1000 mg via INTRAVENOUS
  Filled 2016-07-14: qty 100

## 2016-07-14 MED ORDER — FAMOTIDINE IN NACL 20-0.9 MG/50ML-% IV SOLN
20.0000 mg | Freq: Two times a day (BID) | INTRAVENOUS | Status: DC
  Administered 2016-07-14 – 2016-07-20 (×13): 20 mg via INTRAVENOUS
  Filled 2016-07-14 (×13): qty 50

## 2016-07-14 MED ORDER — SODIUM CHLORIDE 0.9 % IV BOLUS (SEPSIS)
500.0000 mL | Freq: Once | INTRAVENOUS | Status: AC
  Administered 2016-07-14: 500 mL via INTRAVENOUS

## 2016-07-14 MED ORDER — ALBUMIN HUMAN 5 % IV SOLN
25.0000 g | Freq: Once | INTRAVENOUS | Status: AC
  Administered 2016-07-14: 25 g via INTRAVENOUS
  Filled 2016-07-14: qty 500

## 2016-07-14 MED ORDER — ACETAMINOPHEN 10 MG/ML IV SOLN
1000.0000 mg | Freq: Four times a day (QID) | INTRAVENOUS | Status: AC | PRN
  Administered 2016-07-14: 1000 mg via INTRAVENOUS
  Filled 2016-07-14: qty 100

## 2016-07-14 NOTE — Consult Note (Signed)
PULMONARY / CRITICAL CARE MEDICINE   Name: Travis Gould MRN: [id number] DOB: 05/18/1971    ADMISSION DATE:  07/12/2016 CONSULTATION DATE:  07/14/2016  REFERRING MD:  CCS  CHIEF COMPLAINT:  VDRF post bowel resection  HISTORY OF PRESENT ILLNESS:   45 yo AAM with known rt inguinal that became non reducable 12/5 and he was taken to OR 12/10 for inguinal hernia repair and MESH placement.  Discharged home. He developed first N/V then diarrhea and [facility] to OR for rt groin exploration that turned into open abd lap with rt partial colectomy for damaged bowel. He is in SICU on vent , requiring pressor support and will need to return to OR in near future. PCCM asked to assist in his care.  PAST MEDICAL HISTORY :  He  has a past medical history of Hernia, inguinal (07/2016) and Obesity.  PAST SURGICAL HISTORY: He  has a past surgical history that includes Inguinal hernia repair (07/07/2016); Inguinal hernia repair (Right, 07/07/2016); and Insertion of mesh (Right, 07/07/2016).  No Known Allergies  No current facility-administered medications on file prior to encounter.    Current Outpatient Prescriptions on File Prior to Encounter  Medication Sig  . docusate sodium (COLACE) 100 MG capsule You can use this to help keep stools soft.  Use as needed.  You can buy at any drug store without a prescription. (Patient taking differently: Take 100 mg by mouth daily as needed for mild constipation. You can use this to help keep stools soft.  Use as needed.  You can buy at any drug store without a prescription.)  . ondansetron (ZOFRAN) 4 MG tablet Take 1 tablet (4 mg total) by mouth every 6 (six) hours.  Marland Kitchen. oxyCODONE-acetaminophen (PERCOCET/ROXICET) 5-325 MG tablet Take 1-2 tablets by mouth every 4 (four) hours as needed for moderate pain. (Patient taking differently: Take 1-2 tablets by mouth every 4 (four) hours as needed (for pain). )  . acetaminophen (TYLENOL) 325 MG tablet  You can use this as directed on the package.  Tylenol/Acetaminophen is in your prescribed pain medicine. Do not take more than 4000 mg of this per day.  If you get up to 10 tablets per day you are close to maximum dose of Tylenol/acetaminophen allowed per day. (Patient not taking: Reported on 07/12/2016)    FAMILY HISTORY:  His has no family status information on file.   SOCIAL HISTORY: He  reports that he has never smoked. He has never used smokeless tobacco. He reports that he drinks alcohol. He reports that he uses drugs, including Marijuana.  REVIEW OF SYSTEMS:   Unable to obtain  SUBJECTIVE:  Sedated on vent  VITAL SIGNS: BP 93/63   Pulse (!) 125   Temp (!) 102.2 F (39 C) (Axillary)   Resp 20   Ht 5\' 11"  (1.803 m)   Wt 257 lb 8 oz (116.8 kg)   SpO2 98%   BMI 35.91 kg/m   HEMODYNAMICS: CVP:  [3 mmHg-10 mmHg] 10 mmHg  VENTILATOR SETTINGS: Vent Mode: PRVC FiO2 (%):  [55 %-90 %] 55 % Set Rate:  [22 bmp] 22 bmp Vt Set:  [550 mL] 550 mL PEEP:  [5 cmH20] 5 cmH20 Plateau Pressure:  [16 cmH20-20 cmH20] 18 cmH20  INTAKE / OUTPUT: I/O last 3 completed shifts: In: 11425.7 [I.V.:9825.7; NG/GT:150; IV Piggyback:1450] Out: 8800 [Urine:1925; Emesis/NG output:4600; Drains:300; Other:1500; Stool:400; Blood:75]  PHYSICAL EXAMINATION: General:  MO(257 lbs)sedated on vent Neuro:  Sedated but arouses  to voice HEENT:  ET/NG in place Cardiovascular:  HSR RRR Lungs:Decreased bs in bases Abdomen:  Open abd wound with vac in place Musculoskeletal:  Intact Skin:  Warm and dry  LABS:  BMET  Recent Labs Lab 07/11/16 1403 07/12/16 1840 07/12/16 1851 07/13/16 0132 07/14/16 0400  NA 136 132* 134* 147* 136  K 4.0 3.6 3.7 3.4* 4.2  CL 97* 90* 99*  --  103  CO2 25 19*  --   --  22  BUN 17 40* 42*  --  40*  CREATININE 1.13 3.36* 2.80*  --  1.90*  GLUCOSE 152* 226* 225*  --  124*    Electrolytes  Recent Labs Lab 07/11/16 1403 07/12/16 1840 07/14/16 0400  CALCIUM 9.7  8.5* 6.8*    CBC  Recent Labs Lab 07/12/16 1840  07/13/16 0132 07/13/16 0740 07/14/16 0400  WBC 9.5  --   --  10.8* 13.4*  HGB 18.8*  < > 11.6* 14.9 14.9  HCT 55.0*  < > 34.0* 42.7 44.2  PLT 257  --   --  232 210  < > = values in this interval not displayed.  Coag's No results for input(s): APTT, INR in the last 168 hours.  Sepsis Markers  Recent Labs Lab 07/13/16 0258 07/13/16 0730 07/14/16 0747  LATICACIDVEN 5.3* 4.5* 3.6*    ABG  Recent Labs Lab 07/13/16 0132 07/13/16 0253 07/14/16 0752  PHART 7.453* 7.316* 7.313*  PCO2ART 42.6 51.6* 48.2*  PO2ART 224.0* 86.0 174.0*    Liver Enzymes  Recent Labs Lab 07/11/16 1403 07/12/16 1840  AST 21 61*  ALT 37 <5*  ALKPHOS 93 85  BILITOT 0.7 0.8  ALBUMIN 3.8 3.3*    Cardiac Enzymes No results for input(s): TROPONINI, PROBNP in the last 168 hours.  Glucose No results for input(s): GLUCAP in the last 168 hours.  Imaging Dg Chest Port 1 View  Result Date: 07/14/2016 CLINICAL DATA:  Fever, EXAM: PORTABLE CHEST 1 VIEW COMPARISON:  Portable chest x-ray of 07/13/2016 FINDINGS: The tip of the endotracheal tube is approximately 5.0 cm above the carina. Aeration has improved somewhat. There is persistent left basilar linear atelectasis present. Left central venous line tip overlies the lower SVC. Heart size is stable. No bony abnormality is seen. IMPRESSION: 1. Interval improvement in aeration. 2. Persistent left basilar linear atelectasis. 3. Endotracheal tip 5.0 cm above the carina. Electronically Signed   By: Dwyane Dee M.D.   On: 07/14/2016 08:46   Dg Chest Port 1 View  Result Date: 07/13/2016 CLINICAL DATA:  Central line placement. EXAM: PORTABLE CHEST 1 VIEW COMPARISON:  07/13/2016 at 5:32 a.m. FINDINGS: New left subclavian central venous line has its tip projecting in the lower superior vena cava just above the caval atrial junction. No pneumothorax. Endotracheal tube and nasal/ orogastric tube are stable and  well positioned. Lung volumes remain low. There is basilar lung opacity consistent with atelectasis, similar to the earlier study. IMPRESSION: 1. New left subclavian central venous line is well positioned with its tip in the lower superior vena cava. No pneumothorax. No other change from the earlier study. Electronically Signed   By: Amie Portland M.D.   On: 07/13/2016 13:01     STUDIES:  CT abd/pelvis >> Recurrent Rt inguinal hernia with SBO  CULTURES: 12/10 bc>> 12/12 sputum>>  ANTIBIOTICS: 12/11 Zosyn >>  SIGNIFICANT EVENTS: 12/10 open lap  LINES/TUBES: 12/10 et>> 12/10 lt [place] cvl>> 12/10 aline>>  DISCUSSION: 45 yo AAM with known rt inguinal  that became non reducable 12/5 and he was taken to OR 12/10 for inguinal hernia repair and MESH placement.  Discharged home. He developed first N/V then diarrhea and [facility] and was taken to OR for rt groin exploration that turned into open abd lap with rt partial colectomy for damaged bowel. He is in SICU on vent , requiring pressor support and will need to return to OR in near future. PCCM asked to assist in his care.  ASSESSMENT / PLAN:  PULMONARY A: VDRF post lap/sepsis/open abd Mild co2 retention P:   Vent bundle NO wean till abd closed Increased rr to 28 and fio2 decreased to 50 Follow up abg  CARDIOVASCULAR A:  Hypotension/shock. Presumed sepsis from bowel source P:  CVP 7.  IVF as needed Pressors Follow lactic acid   RENAL A:   Acte renal insuff improving Hypokalemia Lactic acidosis 5.3->3.6 P:   Follow labs Lytes treatment as needed  GASTROINTESTINAL A:   Post bowel resection 12/10 P:   NPO NGT to suction  HEMATOLOGIC A:   No acute issue P:  Follow labs   INFECTIOUS A:   Incarcerated rt inguinal hernia with surgery 12/5 for closure/mesh 12/10 open lap and rt inguinal site opened. Bowel resection 12/10 P:   Zosyn #1/x  ENDOCRINE A:   Hyperglyemia P:   Follow glucose  onlabs  NEUROLOGIC A:   Sedated on vent P:   RASS goal: -1 while intubated and abd open Wean sedation once stable and ready for intubation   FAMILY  - Updates: None at bedside  - Inter-disciplinary family meet or Palliative Care meeting due by: day 7    CCT= 60 min  Brett CanalesSteve Minor ACNP [facility] PCCM Pager [phone] till 3 pm If no answer page [phone] 07/14/2016, 9:49 AM  45 yo male with Rt inguinal hernia s/p repair 12/05.  Developed recurrence with SBO.  Had repeat laparotomy.  Open abdomen, remains on vent/pressors.  Sedated.  HR regular.  No wheeze.  Wound vac.  1+ edema.  CXR - ATX WBC 13.5, Creatinine 1.9, LA 3.6, pH 7.31, PCO2 48, PO2 174  Assessment/plan: Acute respiratory failure - full vent support - adjust RR - f/u CXR, ABG  Sepsis from intraabdominal source. - continue Abx  Hypotension from sepsis and hypovolemia. - continue volume resuscitation - pressors to keep MAP > 65  AKI. - monitor renal fx, urine outpt  Recurrent Rt inguinal hernia with SBO. - post op care, nutrition per surgery  CC time by me independent of APP time 33 minutes.  Coralyn HellingVineet Sachin Ferencz, MD [facility] 07/14/2016, 10:49 AM Pager:  [phone] After 3pm call: [phone]

## 2016-07-14 NOTE — Progress Notes (Signed)
Elink RN notified during MD shift change of rectal temp of 105.7 F after cooling blanket rectal probe applied. Awaiting MD response. Additional ice pack's applied. Will continue to monitor closely. A line also dislodged while placing cooling blanket. Will inform MD and continue to monitor closely. Modena JanskyKevin Koby Pickup RN [address] [place]

## 2016-07-14 NOTE — Progress Notes (Signed)
Dr. Dellie CatholicSommers notified of oral temp of 102 F. No prn's available and ice packs being ineffective. Order received for cooling blanket. Portable equipment notified of need. Will continue to monitor closely. Modena JanskyKevin Tanya Marvin RN

## 2016-07-14 NOTE — Progress Notes (Signed)
CCS/Shelina Luo Progress Note 1 Day Post-Op  Subjective: Patient still on major pressors and has low urine output  Objective: Vital signs in last 24 hours: Temp:  [97.1 F (36.2 C)-102.2 F (39 C)] 102.2 F (39 C) (12/12 0727) Pulse Rate:  [52-137] 130 (12/12 0727) Resp:  [0-28] 22 (12/12 0727) BP: (77-133)/(48-121) 98/54 (12/12 0727) SpO2:  [96 %-100 %] 98 % (12/12 0727) Arterial Line BP: (83-128)/(48-68) 109/62 (12/12 0715) FiO2 (%):  [55 %-90 %] 55 % (12/12 0727) Last BM Date:  (UTA)  Intake/Output from previous day: 12/11 0701 - 12/12 0700 In: 4193.8 [I.V.:3453.8; NG/GT:90; IV Piggyback:650] Out: 4125 [Urine:1325; Emesis/NG output:2500; Drains:300] Intake/Output this shift: No intake/output data recorded.  General: No distress, but being sedated somewhat.  Lungs: Clear to auscultation.  FIO2 55%.  ABG pending.    Abd: Open with VAC.  Minimally tender.  Extremities: No chagnes  Neuro: Intact.  Sedated.  Lab Results:  @LABLAST2 (wbc:2,hgb:2,hct:2,plt:2) BMET ) Recent Labs  07/12/16 1840 07/12/16 1851 07/13/16 0132 07/14/16 0400  NA 132* 134* 147* 136  K 3.6 3.7 3.4* 4.2  CL 90* 99*  --  103  CO2 19*  --   --  22  GLUCOSE 226* 225*  --  124*  BUN 40* 42*  --  40*  CREATININE 3.36* 2.80*  --  1.90*  CALCIUM 8.5*  --   --  6.8*   PT/INR No results for input(s): LABPROT, INR in the last 72 hours. ABG  Recent Labs  07/13/16 0132 07/13/16 0253  PHART 7.453* 7.316*  HCO3 29.3* 26.2    Studies/Results: Ct Abdomen Pelvis Wo Contrast  Result Date: 07/12/2016 CLINICAL DATA:  45 year old male with pain and swelling of the left testicle radiating into the stomach. Evaluate for distal bowel obstruction. Recent right inguinal hernia repair. EXAM: CT ABDOMEN AND PELVIS WITHOUT CONTRAST TECHNIQUE: Multidetector CT imaging of the abdomen and pelvis was performed following the standard protocol without IV contrast. COMPARISON:  Abdominal CT dated 07/07/2016 FINDINGS:  Evaluation of this exam is limited in the absence of intravenous contrast. Lower chest: Minimal left lung base atelectatic changes. The visualized portions of the lungs are otherwise clear. No intra-abdominal free air or free fluid. Hepatobiliary: No focal liver abnormality is seen. No gallstones, gallbladder wall thickening, or biliary dilatation. Pancreas: Unremarkable. No pancreatic ductal dilatation or surrounding inflammatory changes. Spleen: Normal in size without focal abnormality. Adrenals/Urinary Tract: Adrenal glands are unremarkable. Kidneys are normal, without renal calculi, focal lesion, or hydronephrosis. Bladder is unremarkable. Stomach/Bowel: Evaluation of the bowel is limited in the absence of oral contrast. The stomach is distended with fluid content. There is dilatation of fluid-filled loops of small bowel throughout the abdomen with air-fluid level. The small bowel measures up to 4.8 cm in caliber. A transition zone is noted in the right inguinal hernia. The cecum and the appendix remain in the right lower quadrant above the inguinal ligament. The appendix appears unremarkable. Small amount of fluid noted throughout the colon. The colon is otherwise decompressed. Vascular/Lymphatic: The abdominal aorta and IVC appear grossly unremarkable on this noncontrast study. No portal venous gas identified. There is no adenopathy. Reproductive: The prostate and seminal vesicles are grossly unremarkable. Other: There is a 5.5 cm right inguinal defect with herniation of the distal small bowel and distal ileum into the right inguinal canal. A transition zone is noted in the right inguinal canal. Fluid-filled and mildly dilated loop of small bowel is noted within the right inguinal hernia. There is inflammatory  changes with a small amount of fluid in the right inguinal hernia. There is thickening of the scrotal wall with a small hydrocele. There is stranding of the subcutaneous fat adjacent to the inguinal  hernia with a small speck of subcutaneous air likely related to recent surgery. She small pockets of air within the right inguinal hernia appear to be intraluminal. No evidence of pneumatosis. Musculoskeletal: No acute osseous pathology identified. Right iliac bone lesion as seen on the prior studies. IMPRESSION: Recurrent right inguinal hernia defect containing distal ileum resulting in small-bowel obstruction with transition zone in the distal ileum within the right inguinal hernia. The cecum and appendix remain in the right lower quadrant above the inguinal ligament. No evidence of pneumatosis. No portal venous gas. Electronically Signed   By: Elgie Collard M.D.   On: 07/12/2016 22:38   Dg Chest Port 1 View  Result Date: 07/13/2016 CLINICAL DATA:  Central line placement. EXAM: PORTABLE CHEST 1 VIEW COMPARISON:  07/13/2016 at 5:32 a.m. FINDINGS: New left subclavian central venous line has its tip projecting in the lower superior vena cava just above the caval atrial junction. No pneumothorax. Endotracheal tube and nasal/ orogastric tube are stable and well positioned. Lung volumes remain low. There is basilar lung opacity consistent with atelectasis, similar to the earlier study. IMPRESSION: 1. New left subclavian central venous line is well positioned with its tip in the lower superior vena cava. No pneumothorax. No other change from the earlier study. Electronically Signed   By: Amie Portland M.D.   On: 07/13/2016 13:01   Dg Chest Port 1 View  Result Date: 07/13/2016 CLINICAL DATA:  Intubation . EXAM: PORTABLE CHEST 1 VIEW COMPARISON:  07/12/2016. FINDINGS: Endotracheal tube and NG tube in stable position. Cardiomegaly with normal pulmonary vascularity. Prominent bilateral lower lobe atelectasis and infiltrates and small left pleural effusion. No pneumothorax. IMPRESSION: 1. Lines and tubes in stable position. 2. Prominent bilateral lower lobe atelectasis and infiltrates. Small left pleural effusion  . Electronically Signed   By: Maisie Fus  Register   On: 07/13/2016 07:31   Dg Chest Portable 1 View  Result Date: 07/12/2016 CLINICAL DATA:  Right-sided chest and abdominal pain EXAM: PORTABLE CHEST 1 VIEW COMPARISON:  None. FINDINGS: Cardiac shadow is within normal limits. The lungs are well aerated with mild left basilar atelectasis. No focal confluent infiltrate is seen. No effusion or pneumothorax is noted. No bony abnormality is seen. IMPRESSION: Minimal left basilar atelectasis. Electronically Signed   By: Alcide Clever M.D.   On: 07/12/2016 19:12    Anti-infectives: Anti-infectives    Start     Dose/Rate Route Frequency Ordered Stop   07/13/16 0600  piperacillin-tazobactam (ZOSYN) IVPB 3.375 g     3.375 g 12.5 mL/hr over 240 Minutes Intravenous Every 8 hours 07/13/16 0225     07/12/16 2300  piperacillin-tazobactam (ZOSYN) IVPB 3.375 g     3.375 g 100 mL/hr over 30 Minutes Intravenous  Once 07/12/16 2246 07/12/16 2309   07/12/16 2300  vancomycin (VANCOCIN) 2,000 mg in sodium chloride 0.9 % 500 mL IVPB     2,000 mg 250 mL/hr over 120 Minutes Intravenous  Once 07/12/16 2246 07/13/16 0101      Assessment/Plan: s/p Procedure(s): INCARCERATED INGUINAL HERNIORRHAPHY, partial colectomy  Will get CCM involved as this will  require more attention to the management of his volume and pressors thatn I can do at this point with the service being so busy  Probably cannot do surgery today because too unstable.  Hopefully tomorrow.  LOS: 1 day   Marta LamasJames O. Gae BonWyatt, III, MD, FACS [phone]--pager [phone]--office [facility] 07/14/2016

## 2016-07-15 ENCOUNTER — Inpatient Hospital Stay (HOSPITAL_COMMUNITY): Payer: Medicaid Other | Admitting: Certified Registered Nurse Anesthetist

## 2016-07-15 ENCOUNTER — Inpatient Hospital Stay (HOSPITAL_COMMUNITY): Payer: Medicaid Other

## 2016-07-15 ENCOUNTER — Encounter (HOSPITAL_COMMUNITY): Payer: Self-pay | Admitting: Certified Registered Nurse Anesthetist

## 2016-07-15 ENCOUNTER — Encounter (HOSPITAL_COMMUNITY): Admission: EM | Disposition: A | Discharge status: Home Health | Payer: Self-pay | Source: Home / Self Care

## 2016-07-15 HISTORY — PX: BOWEL RESECTION: SHX1257

## 2016-07-15 HISTORY — PX: APPENDECTOMY: SHX54

## 2016-07-15 HISTORY — PX: LAPAROTOMY: SHX154

## 2016-07-15 LAB — POCT I-STAT 3, ART BLOOD GAS (G3+)
Acid-base deficit: 1 mmol/L (ref 0.0–2.0)
BICARBONATE: 22.8 mmol/L (ref 20.0–28.0)
O2 Saturation: 99 %
PH ART: 7.433 (ref 7.350–7.450)
TCO2: 24 mmol/L (ref 0–100)
pCO2 arterial: 34.1 mmHg (ref 32.0–48.0)
pO2, Arterial: 130 mmHg — ABNORMAL HIGH (ref 83.0–108.0)

## 2016-07-15 LAB — BASIC METABOLIC PANEL
Anion gap: 12 (ref 5–15)
BUN: 48 mg/dL — ABNORMAL HIGH (ref 6–20)
CALCIUM: 7 mg/dL — AB (ref 8.9–10.3)
CO2: 16 mmol/L — ABNORMAL LOW (ref 22–32)
CREATININE: 1.76 mg/dL — AB (ref 0.61–1.24)
Chloride: 105 mmol/L (ref 101–111)
GFR calc non Af Amer: 45 mL/min — ABNORMAL LOW (ref >60)
GFR, EST AFRICAN AMERICAN: 53 mL/min — AB (ref >60)
Glucose, Bld: 182 mg/dL — ABNORMAL HIGH (ref 65–99)
Potassium: 3.8 mmol/L (ref 3.5–5.1)
SODIUM: 133 mmol/L — AB (ref 135–145)

## 2016-07-15 LAB — CBC
HCT: 43.5 % (ref 39.0–52.0)
Hemoglobin: 15 g/dL (ref 13.0–17.0)
MCH: 29.5 pg (ref 26.0–34.0)
MCHC: 34.5 g/dL (ref 30.0–36.0)
MCV: 85.6 fL (ref 78.0–100.0)
PLATELETS: 258 10*3/uL (ref 150–400)
RBC: 5.08 MIL/uL (ref 4.22–5.81)
RDW: 14.2 % (ref 11.5–15.5)
WBC: 15.6 10*3/uL — ABNORMAL HIGH (ref 4.0–10.5)

## 2016-07-15 LAB — LACTIC ACID, PLASMA
LACTIC ACID, VENOUS: 2.7 mmol/L — AB (ref 0.5–1.9)
LACTIC ACID, VENOUS: 2.9 mmol/L — AB (ref 0.5–1.9)

## 2016-07-15 LAB — GLUCOSE, CAPILLARY
GLUCOSE-CAPILLARY: 143 mg/dL — AB (ref 65–99)
GLUCOSE-CAPILLARY: 120 mg/dL — AB (ref 65–99)
Glucose-Capillary: 142 mg/dL — ABNORMAL HIGH (ref 65–99)
Glucose-Capillary: 146 mg/dL — ABNORMAL HIGH (ref 65–99)
Glucose-Capillary: 149 mg/dL — ABNORMAL HIGH (ref 65–99)

## 2016-07-15 LAB — URINE CULTURE: Culture: NO GROWTH

## 2016-07-15 LAB — HEPATIC FUNCTION PANEL
ALT: 29 U/L (ref 17–63)
AST: 57 U/L — ABNORMAL HIGH (ref 15–41)
Albumin: 2.2 g/dL — ABNORMAL LOW (ref 3.5–5.0)
Alkaline Phosphatase: 56 U/L (ref 38–126)
BILIRUBIN INDIRECT: 0.4 mg/dL (ref 0.3–0.9)
Bilirubin, Direct: 0.3 mg/dL (ref 0.1–0.5)
TOTAL PROTEIN: 5.6 g/dL — AB (ref 6.5–8.1)
Total Bilirubin: 0.7 mg/dL (ref 0.3–1.2)

## 2016-07-15 SURGERY — LAPAROTOMY, EXPLORATORY
Anesthesia: General | Site: Abdomen

## 2016-07-15 MED ORDER — ROCURONIUM BROMIDE 100 MG/10ML IV SOLN
INTRAVENOUS | Status: DC | PRN
  Administered 2016-07-15 (×2): 50 mg via INTRAVENOUS

## 2016-07-15 MED ORDER — ALBUMIN HUMAN 5 % IV SOLN
25.0000 g | Freq: Once | INTRAVENOUS | Status: AC
  Administered 2016-07-15: 25 g via INTRAVENOUS

## 2016-07-15 MED ORDER — ACETAMINOPHEN 10 MG/ML IV SOLN: 1000.0000 mg | Freq: Four times a day (QID) | INTRAVENOUS | Status: AC | PRN

## 2016-07-15 MED ORDER — LACTATED RINGERS IV SOLN
INTRAVENOUS | Status: DC | PRN
  Administered 2016-07-15 (×2): via INTRAVENOUS

## 2016-07-15 MED ORDER — ALBUMIN HUMAN 5 % IV SOLN
INTRAVENOUS | Status: AC
  Filled 2016-07-15: qty 500

## 2016-07-15 MED ORDER — INSULIN ASPART 100 UNIT/ML ~~LOC~~ SOLN
0.0000 [IU] | SUBCUTANEOUS | Status: DC
  Administered 2016-07-15 (×2): 1 [IU] via SUBCUTANEOUS
  Administered 2016-07-16: 2 [IU] via SUBCUTANEOUS
  Administered 2016-07-16: 1 [IU] via SUBCUTANEOUS
  Administered 2016-07-16: 4 [IU] via SUBCUTANEOUS
  Administered 2016-07-16 – 2016-07-25 (×35): 1 [IU] via SUBCUTANEOUS

## 2016-07-15 MED ORDER — VASOPRESSIN 20 UNIT/ML IV SOLN
INTRAVENOUS | Status: AC
  Filled 2016-07-15: qty 1

## 2016-07-15 MED ORDER — POVIDONE-IODINE 10 % EX OINT
TOPICAL_OINTMENT | CUTANEOUS | Status: AC
  Filled 2016-07-15: qty 28.35

## 2016-07-15 MED ORDER — MIDAZOLAM HCL 5 MG/5ML IJ SOLN
INTRAMUSCULAR | Status: DC | PRN
  Administered 2016-07-15: 2 mg via INTRAVENOUS

## 2016-07-15 MED ORDER — ARTIFICIAL TEARS OP OINT
TOPICAL_OINTMENT | OPHTHALMIC | Status: DC | PRN
  Administered 2016-07-15: 1 via OPHTHALMIC

## 2016-07-15 MED ORDER — SODIUM CHLORIDE 0.9 % IV BOLUS (SEPSIS)
500.0000 mL | Freq: Once | INTRAVENOUS | Status: AC
  Administered 2016-07-15: 500 mL via INTRAVENOUS

## 2016-07-15 MED ORDER — 0.9 % SODIUM CHLORIDE (POUR BTL) OPTIME
TOPICAL | Status: DC | PRN
  Administered 2016-07-15 (×2): 1000 mL

## 2016-07-15 MED ORDER — ROCURONIUM BROMIDE 50 MG/5ML IV SOSY
PREFILLED_SYRINGE | INTRAVENOUS | Status: AC
  Filled 2016-07-15: qty 5

## 2016-07-15 MED ORDER — MIDAZOLAM HCL 2 MG/2ML IJ SOLN
INTRAMUSCULAR | Status: AC
  Filled 2016-07-15: qty 2

## 2016-07-15 MED ORDER — PROPOFOL 10 MG/ML IV BOLUS
INTRAVENOUS | Status: DC | PRN
  Administered 2016-07-15: 30 mg via INTRAVENOUS

## 2016-07-15 SURGICAL SUPPLY — 46 items
APL SKNCLS STERI-STRIP NONHPOA (GAUZE/BANDAGES/DRESSINGS) ×1
BENZOIN TINCTURE PRP APPL 2/3 (GAUZE/BANDAGES/DRESSINGS) ×2 IMPLANT
BLADE SURG ROTATE 9660 (MISCELLANEOUS) IMPLANT
BRR ADH 5X3 SEPRAFILM 6 SHT (MISCELLANEOUS)
CANISTER SUCTION 2500CC (MISCELLANEOUS) ×3 IMPLANT
CANISTER WOUND CARE 500ML ATS (WOUND CARE) ×2 IMPLANT
CHLORAPREP W/TINT 26ML (MISCELLANEOUS) ×3 IMPLANT
COVER SURGICAL LIGHT HANDLE (MISCELLANEOUS) ×3 IMPLANT
DRAPE LAPAROSCOPIC ABDOMINAL (DRAPES) ×3 IMPLANT
DRAPE WARM FLUID 44X44 (DRAPE) ×3 IMPLANT
DRSG OPSITE POSTOP 4X10 (GAUZE/BANDAGES/DRESSINGS) IMPLANT
DRSG OPSITE POSTOP 4X8 (GAUZE/BANDAGES/DRESSINGS) IMPLANT
ELECT BLADE 6.5 EXT (BLADE) IMPLANT
ELECT CAUTERY BLADE 6.4 (BLADE) ×3 IMPLANT
ELECT REM PT RETURN 9FT ADLT (ELECTROSURGICAL) ×3
ELECTRODE REM PT RTRN 9FT ADLT (ELECTROSURGICAL) ×1 IMPLANT
GLOVE BIOGEL PI IND STRL 8 (GLOVE) ×1 IMPLANT
GLOVE BIOGEL PI INDICATOR 8 (GLOVE) ×2
GLOVE ECLIPSE 7.5 STRL STRAW (GLOVE) ×3 IMPLANT
GOWN STRL REUS W/ TWL LRG LVL3 (GOWN DISPOSABLE) ×2 IMPLANT
GOWN STRL REUS W/TWL LRG LVL3 (GOWN DISPOSABLE) ×6
KIT BASIN OR (CUSTOM PROCEDURE TRAY) ×3 IMPLANT
KIT ROOM TURNOVER OR (KITS) ×3 IMPLANT
LIGASURE IMPACT 36 18CM CVD LR (INSTRUMENTS) ×2 IMPLANT
NS IRRIG 1000ML POUR BTL (IV SOLUTION) ×6 IMPLANT
PACK GENERAL/GYN (CUSTOM PROCEDURE TRAY) ×3 IMPLANT
PAD ARMBOARD 7.5X6 YLW CONV (MISCELLANEOUS) ×3 IMPLANT
RELOAD PROXIMATE TA60MM BLUE (ENDOMECHANICALS) ×3 IMPLANT
RELOAD STAPLE 60 BLU REG PROX (ENDOMECHANICALS) IMPLANT
SEPRAFILM PROCEDURAL PACK 3X5 (MISCELLANEOUS) IMPLANT
SPECIMEN JAR LARGE (MISCELLANEOUS) IMPLANT
SPONGE ABDOMINAL VAC ABTHERA (MISCELLANEOUS) ×2 IMPLANT
SPONGE LAP 18X18 X RAY DECT (DISPOSABLE) IMPLANT
STAPLER GUN LINEAR PROX 60 (STAPLE) ×2 IMPLANT
STAPLER PROXIMATE 75MM BLUE (STAPLE) ×2 IMPLANT
STAPLER VISISTAT 35W (STAPLE) ×3 IMPLANT
SUCTION POOLE TIP (SUCTIONS) ×3 IMPLANT
SUT NOVA 1 T20/GS 25DT (SUTURE) IMPLANT
SUT PDS AB 1 TP1 96 (SUTURE) ×6 IMPLANT
SUT SILK 2 0 SH CR/8 (SUTURE) ×3 IMPLANT
SUT SILK 2 0 TIES 10X30 (SUTURE) ×3 IMPLANT
SUT SILK 3 0 SH CR/8 (SUTURE) ×3 IMPLANT
SUT SILK 3 0 TIES 10X30 (SUTURE) ×3 IMPLANT
TOWEL OR 17X26 10 PK STRL BLUE (TOWEL DISPOSABLE) ×3 IMPLANT
TRAY FOLEY CATH 16FRSI W/METER (SET/KITS/TRAYS/PACK) IMPLANT
YANKAUER SUCT BULB TIP NO VENT (SUCTIONS) IMPLANT

## 2016-07-15 NOTE — Op Note (Signed)
OPERATIVE REPORT  DATE OF OPERATION: 07/15/2016  PATIENT:  Travis Gould  45 y.o. male  PRE-OPERATIVE DIAGNOSIS:  Open Abdomen  POST-OPERATIVE DIAGNOSIS:  Open Abdomen and Ischemic bowel  INDICATION(S) FOR OPERATION:  Open abdomen with worsening sepsis and bowel in discontinuity  FINDINGS:  Ischemic distal small bowel  PROCEDURE:  Procedure(s): EXPLORATORY LAPAROTOMY Small bowel anastomosis Incidental appendectomy  SURGEON:  Surgeon(s): Jimmye NormanJames Lewanda Perea, MD  ASSISTANT: None  ANESTHESIA:   general  COMPLICATIONS:  None  EBL: 20 ml  BLOOD ADMINISTERED: none  DRAINS: Nasogastric Tube, Urinary Catheter (Foley) and NPWD   SPECIMEN:  Source of Specimen:  Appendix and Small segment of distal ileum  COUNTS CORRECT:  YES  PROCEDURE DETAILS: The patient was brought to the operating room directly from the intensive care unit, on the ventilator and on multiple pressors.  He was reading brought back to the operating room because of an open abdomen with a negative pressure wound dressing in place. There was bowel in discontinuity. And this needed to be addressed.  The patient was placed on the operative table. The previously placed negative pressure wound dressing was removed. He was then prepped and draped in usual sterile manner using betadine. This included a wound in his right groin that was where his previous hernia repair was performed.  A proper timeout was performed identifying the patient and the procedure to be performed. Upon exploring the abdomen there was significant distention of the small bowel all the way up to the ligament of Treitz. We milked the small bowel contents back through the ligament of Treitz into the stomach where a total of over 1800 mL of the fluid was aspirated. This was feculent smelling and thickened ileus fluid.  The cecum in the right lower quadrant and the distal small bowel was noted. The very tip of the staple line of the small bowel was slightly  necrotic and therefore we took off another inch of this using a GIA-75 stapler. We then performed a side-to-side anastomosis between the cecum and the distal small bowel using a GIA-75 stapler. The resulting expected enterotomy was closed using a TX 60 stapler. We subsequently performed an incidental appendectomy coming across the base of the appendix with a TX 60 stapler. The mesentery was taken using the LigaSure device.  Once we completed the appendectomy and the anastomosis we ran the small bowel there appeared to be multiple focal spots of potential ischemia of the small bowel. Upon looking at the mucosa of the small bowel near the area of the anastomosis prior to closing the expected enterotomy there was some an exudative were on the mucosal side of the small bowel that appeared to be slightly ischemic. Because of this the decision was made not to close the fascia and put on a negative pressure wound dressing leaving the fascia opened. We will come back to reassess viability of small bowel in 24-48 hours.  PATIENT DISPOSITION:  ICU - intubated and critically ill.   Davis Vannatter 12/13/201712:32 PM

## 2016-07-15 NOTE — Procedures (Signed)
Arterial Catheter Insertion Procedure Note Travis Gould [id number] 05/27/1971  Procedure: Insertion of Arterial Catheter  Indications: Blood pressure monitoring  Procedure Details Consent: Risks of procedure as well as the alternatives and risks of each were explained to the (patient/caregiver).  Consent for procedure obtained. Time Out: Verified patient identification, verified procedure, site/side was marked, verified correct patient position, special equipment/implants available, medications/allergies/relevent history reviewed, required imaging and test results available.  Performed  Maximum sterile technique was used including antiseptics, cap, gloves, gown, hand hygiene, mask and sheet. Skin prep: Chlorhexidine; local anesthetic administered 20 gauge catheter was inserted into right radial artery using the Seldinger technique.  Evaluation Blood flow good; BP tracing good. Complications: No apparent complications.   Sharene SkeansSilva, Dandra Shambaugh C 07/15/2016

## 2016-07-15 NOTE — Transfer of Care (Signed)
Immediate Anesthesia Transfer of Care Note  Patient: Travis Gould  Procedure(s) Performed: Procedure(s): EXPLORATORY LAPAROTOMY; ILEO-COLONIC ANASTOMOSIS; REAPPLICATION OF WOUND VAC (N/A) APPENDECTOMY (N/A) SMALL BOWEL RESECTION (N/A)  Patient Location: SICU  Anesthesia Type:General  Level of Consciousness: Patient remains intubated per anesthesia plan  Airway & Oxygen Therapy: Patient remains intubated per anesthesia plan and Patient placed on Ventilator (see vital sign flow sheet for setting)  Post-op Assessment: Report given to RN and Post -op Vital signs reviewed and stable  Post vital signs: Reviewed and stable  Last Vitals:  Vitals:   07/15/16 0800 07/15/16 0810  BP:    Pulse:  80  Resp:  (!) 32  Temp: 37.3 C     Last Pain:  Vitals:   07/15/16 0800  TempSrc: Rectal  PainSc:          Complications: No apparent anesthesia complications

## 2016-07-15 NOTE — Anesthesia Preprocedure Evaluation (Signed)
Anesthesia Evaluation  Patient identified by MRN, date of birth, ID band  Reviewed: Allergy & Precautions, NPO status , Patient's Chart, lab work & pertinent test results, Unable to perform ROS - Chart review only  Airway Mallampati: Intubated       Dental no notable dental hx. (+)    Pulmonary    Pulmonary exam normal + rhonchi        Cardiovascular Normal cardiovascular exam Rhythm:Regular Rate:Normal     Neuro/Psych    GI/Hepatic Open abdomen   Endo/Other  Morbid obesity  Renal/GU      Musculoskeletal   Abdominal   Peds  Hematology   Anesthesia Other Findings   Reproductive/Obstetrics                             Anesthesia Physical Anesthesia Plan  ASA: III  Anesthesia Plan: General   Post-op Pain Management:    Induction: Inhalational and Intravenous  Airway Management Planned: Oral ETT  Additional Equipment: Arterial line and CVP  Intra-op Plan:   Post-operative Plan: Post-operative intubation/ventilation  Informed Consent:   History available from chart only  Plan Discussed with: CRNA and Surgeon  Anesthesia Plan Comments:         Anesthesia Quick Evaluation                                   Anesthesia Evaluation  Patient identified by MRN, date of birth, ID band Patient awake    Reviewed: Allergy & Precautions, NPO status , Patient's Chart, lab work & pertinent test results  History of Anesthesia Complications Negative for: history of anesthetic complications  Airway Mallampati: II  TM Distance: >3 FB Neck ROM: Full    Dental  (+) Poor Dentition, Missing, Loose, Dental Advisory Given, Chipped   Pulmonary Current Smoker,    breath sounds clear to auscultation       Cardiovascular negative cardio ROS   Rhythm:Regular Rate:Normal     Neuro/Psych negative neurological ROS     GI/Hepatic Neg liver ROS, Incarcerated inguinal  hernia   Endo/Other  Morbid obesity  Renal/GU negative Renal ROS     Musculoskeletal   Abdominal (+) + obese,   Peds  Hematology negative hematology ROS (+)   Anesthesia Other Findings   Reproductive/Obstetrics                            Anesthesia Physical Anesthesia Plan  ASA: II  Anesthesia Plan: General   Post-op Pain Management:    Induction: Intravenous  Airway Management Planned: Oral ETT  Additional Equipment:   Intra-op Plan:   Post-operative Plan: Extubation in OR  Informed Consent: I have reviewed the patients History and Physical, chart, labs and discussed the procedure including the risks, benefits and alternatives for the proposed anesthesia with the patient or authorized representative who has indicated his/her understanding and acceptance.   Dental advisory given  Plan Discussed with: CRNA and Surgeon  Anesthesia Plan Comments: (Plan routine monitors, GETA)        Anesthesia Quick Evaluation

## 2016-07-15 NOTE — Anesthesia Procedure Notes (Signed)
Procedure Name: Intubation Performed by: Val EagleMOSER, Sara Selvidge Pre-anesthesia Checklist: Patient identified, Emergency Drugs available, Suction available, Patient being monitored and Timeout performed Patient Re-evaluated:Patient Re-evaluated prior to inductionOxygen Delivery Method: Circle system utilized Laryngoscope Size: Mac and 4 Grade View: Grade I Tube type: Subglottic suction tube Tube size: 8.0 mm Number of attempts: 1 Airway Equipment and Method: Stylet Placement Confirmation: ETT inserted through vocal cords under direct vision,  positive ETCO2 and breath sounds checked- equal and bilateral Secured at: 22 cm Tube secured with: Tape Dental Injury: Teeth and Oropharynx as per pre-operative assessment

## 2016-07-15 NOTE — Progress Notes (Signed)
PULMONARY / CRITICAL CARE MEDICINE   Name: Travis Gould MRN: [id number] DOB: May 02, 1971    ADMISSION DATE:  07/12/2016 CONSULTATION DATE:  07/14/2016  REFERRING MD:  CCS  CHIEF COMPLAINT:  VDRF post bowel resection  HISTORY OF PRESENT ILLNESS:   45 yo AAM with known rt inguinal that became non reducable 12/5 and he was taken to OR 12/10 for inguinal hernia repair and MESH placement.  Discharged home. He developed first N/V then diarrhea and on 12/10 returned to [facility] and was taken to OR for rt groin exploration that turned into open abd lap with rt partial colectomy for damaged bowel. He is in SICU on vent , requiring pressor support and will need to return to OR in near future. PCCM asked to assist in his care.  SUBJECTIVE:  Sedated on vent. Fever 105.7  VITAL SIGNS: BP (!) 90/48   Pulse 79   Temp 99.2 F (37.3 C) (Rectal)   Resp (!) 26   Ht 5\' 11"  (1.803 m)   Wt 257 lb 8 oz (116.8 kg)   SpO2 99%   BMI 35.91 kg/m   HEMODYNAMICS: CVP:  [8 mmHg-14 mmHg] 8 mmHg  VENTILATOR SETTINGS: Vent Mode: PRVC FiO2 (%):  [35 %-50 %] 35 % Set Rate:  [28 bmp] 28 bmp Vt Set:  [550 mL] 550 mL PEEP:  [5 cmH20] 5 cmH20 Plateau Pressure:  [5 cmH20-19 cmH20] 19 cmH20  INTAKE / OUTPUT: I/O last 3 completed shifts: In: 8189.6 [I.V.:6149.6; NG/GT:90; IV Piggyback:1950] Out: 6780 [Urine:1330; Emesis/NG output:5000; Drains:450]  PHYSICAL EXAMINATION: General:  MO(257 lbs)sedated on vent Neuro:  Sedated but arouses to voice HEENT:  ET/NG in place Cardiovascular:  HSR RRR Lungs:Decreased bs in bases Abdomen:  Open abd wound with vac in place Musculoskeletal:  Intact Skin:  Warm and dry  LABS:  BMET  Recent Labs Lab 07/12/16 1840 07/12/16 1851 07/13/16 0132 07/14/16 0400 07/15/16 0400  NA 132* 134* 147* 136 133*  K 3.6 3.7 3.4* 4.2 3.8  CL 90* 99*  --  103 105  CO2 19*  --   --  22 16*  BUN 40* 42*  --  40* 48*  CREATININE 3.36* 2.80*  --  1.90* 1.76*  GLUCOSE 226* 225*  --   124* 182*    Electrolytes  Recent Labs Lab 07/12/16 1840 07/14/16 0400 07/15/16 0400  CALCIUM 8.5* 6.8* 7.0*    CBC  Recent Labs Lab 07/13/16 0740 07/14/16 0400 07/15/16 0400  WBC 10.8* 13.4* 15.6*  HGB 14.9 14.9 15.0  HCT 42.7 44.2 43.5  PLT 232 210 258    Coag's  Recent Labs Lab 07/14/16 0930  INR 1.48    Sepsis Markers  Recent Labs Lab 07/13/16 0730 07/14/16 0747 07/15/16 0400  LATICACIDVEN 4.5* 3.6* 2.7*    ABG  Recent Labs Lab 07/14/16 0752 07/14/16 1059 07/15/16 0414  PHART 7.313* 7.414 7.433  PCO2ART 48.2* 36.2 34.1  PO2ART 174.0* 168.0* 130.0*    Liver Enzymes  Recent Labs Lab 07/11/16 1403 07/12/16 1840 07/15/16 0400  AST 21 61* 57*  ALT 37 <5* 29  ALKPHOS 93 85 56  BILITOT 0.7 0.8 0.7  ALBUMIN 3.8 3.3* 2.2*    Cardiac Enzymes No results for input(s): TROPONINI, PROBNP in the last 168 hours.  Glucose No results for input(s): GLUCAP in the last 168 hours.  Imaging Dg Chest Port 1 View  Result Date: 07/15/2016 CLINICAL DATA:  Recent abdominal surgery, endotracheal tube position EXAM: PORTABLE CHEST 1 VIEW COMPARISON:  portable chest x-ray of 07/14/2016. FINDINGS: The tip of the endotracheal tube is approximately 5.4 cm above the carina. Left basilar linear atelectasis remains. No pneumonia or effusion is seen. Left central venous line is unchanged in position with the tip overlying the lower SVC near the expected right atrial junction. IMPRESSION: 1. Tip of endotracheal tube 5.4 cm above the carina. 2. No change in left basilar linear atelectasis. Electronically Signed   By: Dwyane DeePaul  Barry M.D.   On: 07/15/2016 08:01     STUDIES:  CT abd/pelvis >> Recurrent Rt inguinal hernia with SBO  CULTURES: 12/10 bc>> 12/12 sputum>>  ANTIBIOTICS: 12/11 Zosyn >>  SIGNIFICANT EVENTS: 12/10 open lap 12/13 return to OR>>  LINES/TUBES: 12/10 et>> 12/10 lt [place] cvl>> 12/10 aline>>  DISCUSSION: 45 yo AAM with known rt inguinal  that became non reducable 12/5 and he was taken to OR 12/10 for inguinal hernia repair and MESH placement.  Discharged home. He developed first N/V then diarrhea and [facility] was taken to OR for rt groin exploration that turned into open abd lap with rt partial colectomy for damaged bowel. He is in SICU on vent , requiring pressor support and will need to return to OR in near future. PCCM asked to assist in his care.  ASSESSMENT / PLAN:  PULMONARY A: VDRF post lap/sepsis/open abd Mild co2 retention P:   Vent bundle NO wean till abd closed Increased rr to 28 and fio2 decreased to 50 Follow up abg  CARDIOVASCULAR A:  Hypotension/shock. Presumed sepsis from bowel source P:  CVP 7->14 IVF as needed Pressors Follow lactic acid   RENAL Lab Results  Component Value Date   CREATININE 1.76 (H) 07/15/2016   CREATININE 1.90 (H) 07/14/2016   CREATININE 2.80 (H) 07/12/2016    A:   Acte renal insuff improving Hypokalemia Lactic acidosis 5.3->3.6->2.7 P:   Follow labs Lytes treatment as needed  GASTROINTESTINAL A:   Post bowel resection 12/10 P:   NPO NGT to suction For OR 1`2/13 possible closure  HEMATOLOGIC A:   No acute issue P:  Follow labs   INFECTIOUS A:   Incarcerated rt inguinal hernia with surgery 12/5 for closure/mesh 12/10 open lap and rt inguinal site opened. Bowel resection 12/10 12/12 spiked fever 105.7 P:   Zosyn #2/x To OR 12/13 explore for possible fever source  ENDOCRINE   A:   Hyperglyemia P:   Follow glucose on labs 12/13 add SSI  NEUROLOGIC A:   Sedated on vent P:   RASS goal: -1 while intubated and abd open Wean sedation once stable and ready for intubation   FAMILY  - Updates: None at bedside  - Inter-disciplinary family meet or Palliative Care meeting due by: day 7    CCT= 30 min  Brett CanalesSteve Minor ACNP Adolph PollackLe Bauer PCCM Pager [phone] till 3 pm If no answer page [phone] 07/15/2016, 9:13 AM  Fever  again overnight.  Remains on pressors.  Sedated.  HR regular.  No wheeze.  Wound vac in place.  1+ edema.  Creatinine 1.76, HCO3 16, WBC 15.6, Hb 15  CXR - basilar ATX  Assessment/plan:  Acute respiratory failure. - full vent support  Septic shock from incarcerated Rt inguinal hernia with bowel obstruction. - continue Abx - back to OR 12/13 - pressors to keep MAP > 65  AKI. Non gap metabolic acidosis. - continue volume resuscitation  CC time by me independent of APP time 33 minutes.  Coralyn HellingVineet Katesha Eichel, MD [place]  Pulmonary/Critical Care 07/15/2016, 11:06 AM Pager:  [phone] After 3pm call: [phone]

## 2016-07-15 NOTE — Progress Notes (Signed)
CCS/Yahel Fuston Progress Note 2 Days Post-Op  Subjective: Appreciate assistance of CCM.  He is better today, but spiking fevers up to 105.7.  Still on pressors.    Objective: Vital signs in last 24 hours: Temp:  [99 F (37.2 C)-105.7 F (40.9 C)] 99 F (37.2 C) (12/13 0400) Pulse Rate:  [25-129] 79 (12/13 0630) Resp:  [18-35] 26 (12/13 0715) BP: (77-100)/(45-68) 90/48 (12/12 2345) SpO2:  [77 %-100 %] 99 % (12/13 0630) Arterial Line BP: (84-118)/(47-70) 93/60 (12/13 0715) FiO2 (%):  [35 %-55 %] 35 % (12/13 0300) Last BM Date:  (UTA)  Intake/Output from previous day: 12/12 0701 - 12/13 0700 In: 5585.6 [I.V.:4175.6; NG/GT:60; IV Piggyback:1350] Out: 4930 [Urine:730; Emesis/NG output:3850; Drains:350] Intake/Output this shift: No intake/output data recorded.  General: Sedated but still on pressors.  Lungs: Clear.  CXR from yesterday shows LLL atelectasis.  Will repeat CXR today.  Abd: Open abdomen, VAC drainage is not enteric.  Not sure about source of fevers.  Extremities: No changes  Neuro: Sedated  Renal:  Urine output is marginal and CVP rangin from 9 to 14 over the last 10 hours.  Renal function slightly worse.  Lab Results:  @LABLAST2 (wbc:2,hgb:2,hct:2,plt:2) BMET ) Recent Labs  07/14/16 0400 07/15/16 0400  NA 136 133*  K 4.2 3.8  CL 103 105  CO2 22 16*  GLUCOSE 124* 182*  BUN 40* 48*  CREATININE 1.90* 1.76*  CALCIUM 6.8* 7.0*   PT/INR  Recent Labs  07/14/16 0930  LABPROT 18.0*  INR 1.48   ABG  Recent Labs  07/14/16 1059 07/15/16 0414  PHART 7.414 7.433  HCO3 22.8 22.8    Studies/Results: Dg Chest Port 1 View  Result Date: 07/14/2016 CLINICAL DATA:  Fever, EXAM: PORTABLE CHEST 1 VIEW COMPARISON:  Portable chest x-ray of 07/13/2016 FINDINGS: The tip of the endotracheal tube is approximately 5.0 cm above the carina. Aeration has improved somewhat. There is persistent left basilar linear atelectasis present. Left central venous line tip overlies  the lower SVC. Heart size is stable. No bony abnormality is seen. IMPRESSION: 1. Interval improvement in aeration. 2. Persistent left basilar linear atelectasis. 3. Endotracheal tip 5.0 cm above the carina. Electronically Signed   By: Dwyane DeePaul  Barry M.D.   On: 07/14/2016 08:46   Dg Chest Port 1 View  Result Date: 07/13/2016 CLINICAL DATA:  Central line placement. EXAM: PORTABLE CHEST 1 VIEW COMPARISON:  07/13/2016 at 5:32 a.m. FINDINGS: New left subclavian central venous line has its tip projecting in the lower superior vena cava just above the caval atrial junction. No pneumothorax. Endotracheal tube and nasal/ orogastric tube are stable and well positioned. Lung volumes remain low. There is basilar lung opacity consistent with atelectasis, similar to the earlier study. IMPRESSION: 1. New left subclavian central venous line is well positioned with its tip in the lower superior vena cava. No pneumothorax. No other change from the earlier study. Electronically Signed   By: Amie Portlandavid  Ormond M.D.   On: 07/13/2016 13:01    Anti-infectives: Anti-infectives    Start     Dose/Rate Route Frequency Ordered Stop   07/13/16 0600  piperacillin-tazobactam (ZOSYN) IVPB 3.375 g     3.375 g 12.5 mL/hr over 240 Minutes Intravenous Every 8 hours 07/13/16 0225     07/12/16 2300  piperacillin-tazobactam (ZOSYN) IVPB 3.375 g     3.375 g 100 mL/hr over 30 Minutes Intravenous  Once 07/12/16 2246 07/12/16 2309   07/12/16 2300  vancomycin (VANCOCIN) 2,000 mg in sodium chloride 0.9 %  500 mL IVPB     2,000 mg 250 mL/hr over 120 Minutes Intravenous  Once 07/12/16 2246 07/13/16 0101      Assessment/Plan: s/p Procedure(s): INCARCERATED INGUINAL HERNIORRHAPHY, partial colectomy  Need to take the patient back to the OR for re=exploration, possible anastomosis, possible closure  LOS: 2 days   Marta LamasJames O. Gae BonWyatt, III, MD, FACS [phone]--pager [phone]--office [facility] 07/15/2016

## 2016-07-15 NOTE — Plan of Care (Signed)
Critical value of lactic acid 2.9 consistent with am lab draw. Ladona Ridgelaylor RN made aware

## 2016-07-15 NOTE — Progress Notes (Signed)
Dr. Bard HerbertSimmonds spoke with regarding temp of 106 F unresponsive to cooling blanket and ice packs. Orders received for IV tylenol and instructed to attempt every manual method to bring temp down. Currently pt is on a cooling blanket with 10 ice packs placed throughout his body. Will give ice bath w/ alcohol and drape with ice cold rags. Will continue to monitor closely. Modena JanskyKevin Jonluke Cobbins RN [address] [place]

## 2016-07-16 ENCOUNTER — Inpatient Hospital Stay (HOSPITAL_COMMUNITY): Payer: Medicaid Other

## 2016-07-16 ENCOUNTER — Encounter (HOSPITAL_COMMUNITY): Payer: Self-pay | Admitting: General Surgery

## 2016-07-16 DIAGNOSIS — K460 Unspecified abdominal hernia with obstruction, without gangrene: Secondary | ICD-10-CM

## 2016-07-16 DIAGNOSIS — A419 Sepsis, unspecified organism: Principal | ICD-10-CM

## 2016-07-16 DIAGNOSIS — R6521 Severe sepsis with septic shock: Secondary | ICD-10-CM

## 2016-07-16 DIAGNOSIS — J9601 Acute respiratory failure with hypoxia: Secondary | ICD-10-CM

## 2016-07-16 LAB — BASIC METABOLIC PANEL
Anion gap: 9 (ref 5–15)
BUN: 30 mg/dL — AB (ref 6–20)
CHLORIDE: 101 mmol/L (ref 101–111)
CO2: 23 mmol/L (ref 22–32)
CREATININE: 1.1 mg/dL (ref 0.61–1.24)
Calcium: 7.1 mg/dL — ABNORMAL LOW (ref 8.9–10.3)
GFR calc Af Amer: >60 mL/min (ref >60)
GFR calc non Af Amer: >60 mL/min (ref >60)
GLUCOSE: 165 mg/dL — AB (ref 65–99)
POTASSIUM: 4.1 mmol/L (ref 3.5–5.1)
SODIUM: 133 mmol/L — AB (ref 135–145)

## 2016-07-16 LAB — GLUCOSE, CAPILLARY
GLUCOSE-CAPILLARY: 142 mg/dL — AB (ref 65–99)
GLUCOSE-CAPILLARY: 93 mg/dL (ref 65–99)
Glucose-Capillary: 142 mg/dL — ABNORMAL HIGH (ref 65–99)
Glucose-Capillary: 157 mg/dL — ABNORMAL HIGH (ref 65–99)
Glucose-Capillary: 120 mg/dL — ABNORMAL HIGH (ref 65–99)
Glucose-Capillary: 105 mg/dL — ABNORMAL HIGH (ref 65–99)

## 2016-07-16 LAB — CBC
HEMATOCRIT: 39.3 % (ref 39.0–52.0)
Hemoglobin: 13.4 g/dL (ref 13.0–17.0)
MCH: 29.1 pg (ref 26.0–34.0)
MCHC: 34.1 g/dL (ref 30.0–36.0)
MCV: 85.2 fL (ref 78.0–100.0)
PLATELETS: 175 10*3/uL (ref 150–400)
RBC: 4.61 MIL/uL (ref 4.22–5.81)
RDW: 14.5 % (ref 11.5–15.5)
WBC: 18.6 10*3/uL — AB (ref 4.0–10.5)

## 2016-07-16 LAB — LACTIC ACID, PLASMA: Lactic Acid, Venous: 1.9 mmol/L (ref 0.5–1.9)

## 2016-07-16 LAB — TRIGLYCERIDES: Triglycerides: 694 mg/dL — ABNORMAL HIGH (ref <150)

## 2016-07-16 MED ORDER — SODIUM CHLORIDE 0.9 % IV BOLUS (SEPSIS)
500.0000 mL | Freq: Once | INTRAVENOUS | Status: AC
  Administered 2016-07-16: 500 mL via INTRAVENOUS

## 2016-07-16 MED ORDER — ALBUMIN HUMAN 5 % IV SOLN: 25.0000 g | Freq: Once | INTRAVENOUS | Status: DC

## 2016-07-16 MED ORDER — MIDAZOLAM HCL 2 MG/2ML IJ SOLN
1.0000 mg | INTRAMUSCULAR | Status: DC | PRN
  Administered 2016-07-16 (×2): 2 mg via INTRAVENOUS
  Administered 2016-07-16 – 2016-07-17 (×2): 4 mg via INTRAVENOUS
  Administered 2016-07-17 (×2): 2 mg via INTRAVENOUS
  Administered 2016-07-18: 4 mg via INTRAVENOUS
  Administered 2016-07-18: 2 mg via INTRAVENOUS
  Administered 2016-07-18 (×2): 4 mg via INTRAVENOUS
  Filled 2016-07-16 (×3): qty 4
  Filled 2016-07-16: qty 2
  Filled 2016-07-16: qty 4
  Filled 2016-07-16: qty 2
  Filled 2016-07-16 (×4): qty 4

## 2016-07-16 MED ORDER — DEXMEDETOMIDINE HCL IN NACL 200 MCG/50ML IV SOLN
0.0000 ug/kg/h | INTRAVENOUS | Status: DC
  Administered 2016-07-16 (×2): 0.7 ug/kg/h via INTRAVENOUS
  Filled 2016-07-16: qty 50

## 2016-07-16 MED ORDER — MIDAZOLAM HCL 2 MG/2ML IJ SOLN
INTRAMUSCULAR | Status: AC
  Administered 2016-07-16: 2 mg
  Filled 2016-07-16: qty 2

## 2016-07-16 MED ORDER — DEXMEDETOMIDINE HCL IN NACL 400 MCG/100ML IV SOLN
0.0000 ug/kg/h | INTRAVENOUS | Status: DC
  Administered 2016-07-16 – 2016-07-17 (×8): 0.7 ug/kg/h via INTRAVENOUS
  Administered 2016-07-18: 0.9 ug/kg/h via INTRAVENOUS
  Administered 2016-07-18 (×3): 1.1 ug/kg/h via INTRAVENOUS
  Administered 2016-07-18: 1 ug/kg/h via INTRAVENOUS
  Administered 2016-07-18: 1.1 ug/kg/h via INTRAVENOUS
  Filled 2016-07-16 (×11): qty 100
  Filled 2016-07-16: qty 200
  Filled 2016-07-16 (×3): qty 100

## 2016-07-16 MED ORDER — SODIUM CHLORIDE 0.9 % IV SOLN: 500.0000 mL | Freq: Once | INTRAVENOUS | Status: DC

## 2016-07-16 MED ORDER — LACTATED RINGERS IV SOLN
INTRAVENOUS | Status: DC
Start: 2016-07-16 — End: 2016-07-21
  Administered 2016-07-16 – 2016-07-17 (×2): via INTRAVENOUS
  Administered 2016-07-18 (×2): 75 mL/h via INTRAVENOUS
  Administered 2016-07-19 – 2016-07-20 (×2): via INTRAVENOUS

## 2016-07-16 MED ORDER — ALBUMIN HUMAN 5 % IV SOLN
25.0000 g | Freq: Once | INTRAVENOUS | Status: AC
  Administered 2016-07-16: 25 g via INTRAVENOUS
  Filled 2016-07-16: qty 250

## 2016-07-16 NOTE — Progress Notes (Signed)
1 Day Post-Op  Subjective: Alert on the vent, still on pressors, and propofol.  Wound vac in place  Objective: Vital signs in last 24 hours: Temp:  [98.6 F (37 C)-100.3 F (37.9 C)] 100.3 F (37.9 C) (12/14 0400) Pulse Rate:  [79-99] 80 (12/14 0700) Resp:  [6-32] 28 (12/14 0700) BP: (78-133)/(56-78) 99/72 (12/14 0700) SpO2:  [87 %-100 %] 100 % (12/14 0700) Arterial Line BP: (60-131)/(35-77) 99/68 (12/14 0700) FiO2 (%):  [35 %-40 %] 35 % (12/14 0400) Weight:  [117.1 kg (258 lb 2.5 oz)] 117.1 kg (258 lb 2.5 oz) (12/14 0500) Last BM Date:  (UTA) 5000 IV Urine 1150 NG 1850 Drain 575 1900 - other listed - not sure what it is nor is the AM nurse. Low grade fever 100.3 recorded at 0300, BP 90's on Pressors/fentanyl and propofol Na 133,  Last latctate 2.9 it is being rechecked WBC 18.6 Intake/Output from previous day: 12/13 0701 - 12/14 0700 In: 6403.6 [I.V.:4981.1; NG/GT:210; IV Piggyback:1212.5] Out: 0938 [Urine:1150; Emesis/NG output:1850; Drains:575; Blood:25] Intake/Output this shift: No intake/output data recorded.  General appearance: alert, cooperative, no distress and On Vent. Resp: comfortable on vent, no wheezing. Cardio: regular sinus rhythm GI: large open wound with wound vac in place, no BS  Lab Results:   Recent Labs  07/15/16 0400 07/16/16 0500  WBC 15.6* 18.6*  HGB 15.0 13.4  HCT 43.5 39.3  PLT 258 175    BMET  Recent Labs  07/15/16 0400 07/16/16 0500  NA 133* 133*  K 3.8 4.1  CL 105 101  CO2 16* 23  GLUCOSE 182* 165*  BUN 48* 30*  CREATININE 1.76* 1.10  CALCIUM 7.0* 7.1*   PT/INR  Recent Labs  07/14/16 0930  LABPROT 18.0*  INR 1.48     Recent Labs Lab 07/11/16 1403 07/12/16 1840 07/15/16 0400  AST 21 61* 57*  ALT 37 <5* 29  ALKPHOS 93 85 56  BILITOT 0.7 0.8 0.7  PROT 8.8* 7.7 5.6*  ALBUMIN 3.8 3.3* 2.2*     Lipase     Component Value Date/Time   LIPASE 18 07/12/2016 1840     Studies/Results: Dg Chest Port 1  View  Result Date: 07/16/2016 CLINICAL DATA:  Respiratory failure EXAM: PORTABLE CHEST 1 VIEW COMPARISON:  07/15/2016 FINDINGS: Endotracheal tube in good position. Central venous catheter tip in the SVC unchanged. NG tube in the stomach. Decreased lung volume. Bibasilar atelectasis left greater than right. No edema or effusion. IMPRESSION: Support lines remain in good position. Bibasilar atelectasis similar to yesterday. Electronically Signed   By: Franchot Gallo M.D.   On: 07/16/2016 07:55   Dg Chest Port 1 View  Result Date: 07/15/2016 CLINICAL DATA:  Recent abdominal surgery, endotracheal tube position EXAM: PORTABLE CHEST 1 VIEW COMPARISON:  portable chest x-ray of 07/14/2016. FINDINGS: The tip of the endotracheal tube is approximately 5.4 cm above the carina. Left basilar linear atelectasis remains. No pneumonia or effusion is seen. Left central venous line is unchanged in position with the tip overlying the lower SVC near the expected right atrial junction. IMPRESSION: 1. Tip of endotracheal tube 5.4 cm above the carina. 2. No change in left basilar linear atelectasis. Electronically Signed   By: Ivar Drape M.D.   On: 07/15/2016 08:01   Dg Chest Port 1 View  Result Date: 07/14/2016 CLINICAL DATA:  Fever, EXAM: PORTABLE CHEST 1 VIEW COMPARISON:  Portable chest x-ray of 07/13/2016 FINDINGS: The tip of the endotracheal tube is approximately 5.0 cm above the  carina. Aeration has improved somewhat. There is persistent left basilar linear atelectasis present. Left central venous line tip overlies the lower SVC. Heart size is stable. No bony abnormality is seen. IMPRESSION: 1. Interval improvement in aeration. 2. Persistent left basilar linear atelectasis. 3. Endotracheal tip 5.0 cm above the carina. Electronically Signed   By: Ivar Drape M.D.   On: 07/14/2016 08:46    Medications: . chlorhexidine gluconate (MEDLINE KIT)  15 mL Mouth Rinse BID  . famotidine (PEPCID) IV  20 mg Intravenous Q12H  .  heparin  5,000 Units Subcutaneous Q8H  . insulin aspart  0-9 Units Subcutaneous Q4H  . mouth rinse  15 mL Mouth Rinse 10 times per day  . piperacillin-tazobactam (ZOSYN)  IV  3.375 g Intravenous Q8H   . dextrose 5 % and 0.45 % NaCl with KCl 20 mEq/L 75 mL/hr at 07/16/16 0400  . fentaNYL infusion INTRAVENOUS 400 mcg/hr (07/16/16 0400)  . norepinephrine (LEVOPHED) Adult infusion 22 mcg/min (07/16/16 0400)  . propofol (DIPRIVAN) infusion 15 mcg/kg/min (07/16/16 0400)  . vasopressin (PITRESSIN) infusion - *FOR SHOCK* 0.03 Units/min (07/16/16 0400)    Assessment/Plan Recurrent right inguinal hernia, strangulation of terminal ileum, bowel obstruction s/p RIH repair of incarcerated hernia, insertion of Mesh  07/07/16, Dr. Georganna Skeans S/p exploration of right groin, reduction of right inguinal hernia, resection of small bowel, placement of 200cm^2 negative pressure dressing; 07/13/16, Dr. Gurney Maxin Exploratory laparotomy/ washout - 07/15/16 Dr Hulen Skains Hypotension/Shock from presumed sepsis Lactic acidosis - improving Hyperglycemia FEN: IV fluids listed above/NPO ID:  Zosyn day 5 DVT:  Heparin    Plan:  Continue on vent and pressors for support.  Anticipate take back to the OR tomorrow.     LOS: 3 days    Karra Pink 07/16/2016 [phone]

## 2016-07-16 NOTE — Progress Notes (Signed)
PULMONARY / CRITICAL CARE MEDICINE   Name: Adah PerlShane Q Vanderburg MRN: [id number] DOB: 10/17/1970    ADMISSION DATE:  07/12/2016 CONSULTATION DATE:  07/14/2016  REFERRING MD:  CCS  CHIEF COMPLAINT:  VDRF post bowel resection  HISTORY OF PRESENT ILLNESS:   45 yo AAM with known rt inguinal that became non reducable 12/5 and he was taken to OR 12/10 for inguinal hernia repair and MESH placement.  Discharged home. He developed first N/V then diarrhea and [facility] 12/10 [facility] and was taken to OR for rt groin exploration that turned into open abd lap with rt partial colectomy for damaged bowel. He is in SICU on vent , requiring pressor support and will need to return to OR in near future. PCCM asked to assist in his care.  SUBJECTIVE:  Sedated on vent. Fever 100.2  VITAL SIGNS: BP 99/72   Pulse 80   Temp 100.3 F (37.9 C) (Rectal)   Resp (!) 28   Ht 5\' 11"  (1.803 m)   Wt 258 lb 2.5 oz (117.1 kg)   SpO2 100%   BMI 36.01 kg/m   HEMODYNAMICS: CVP:  [12 mmHg-15 mmHg] 14 mmHg  VENTILATOR SETTINGS: Vent Mode: PRVC FiO2 (%):  [35 %] 35 % Set Rate:  [28 bmp] 28 bmp Vt Set:  [550 mL] 550 mL PEEP:  [5 cmH20] 5 cmH20 Plateau Pressure:  [20 cmH20-22 cmH20] 20 cmH20  INTAKE / OUTPUT: I/O last 3 completed shifts: In: 8442.4 [I.V.:6839.9; NG/GT:240; IV Piggyback:1362.5] Out: 8100 [Urine:1500; Emesis/NG output:3900; Drains:775; Other:1900; Blood:25]  PHYSICAL EXAMINATION: General:  MO(257 lbs)sedated on vent Neuro:  Sedated but arouses to voice HEENT:  ET/NG in place Cardiovascular:  HSR RRR Lungs:Decreased bs in bases Abdomen:  Open abd wound with vac in place Musculoskeletal:  Intact Skin:  Warm and dry  LABS:  BMET  Recent Labs Lab 07/14/16 0400 07/15/16 0400 07/16/16 0500  NA 136 133* 133*  K 4.2 3.8 4.1  CL 103 105 101  CO2 22 16* 23  BUN 40* 48* 30*  CREATININE 1.90* 1.76* 1.10  GLUCOSE 124* 182* 165*    Electrolytes  Recent Labs Lab 07/14/16 0400  07/15/16 0400 07/16/16 0500  CALCIUM 6.8* 7.0* 7.1*    CBC  Recent Labs Lab 07/14/16 0400 07/15/16 0400 07/16/16 0500  WBC 13.4* 15.6* 18.6*  HGB 14.9 15.0 13.4  HCT 44.2 43.5 39.3  PLT 210 258 175    Coag's  Recent Labs Lab 07/14/16 0930  INR 1.48    Sepsis Markers  Recent Labs Lab 07/14/16 0747 07/15/16 0400 07/15/16 1414  LATICACIDVEN 3.6* 2.7* 2.9*    ABG  Recent Labs Lab 07/14/16 0752 07/14/16 1059 07/15/16 0414  PHART 7.313* 7.414 7.433  PCO2ART 48.2* 36.2 34.1  PO2ART 174.0* 168.0* 130.0*    Liver Enzymes  Recent Labs Lab 07/11/16 1403 07/12/16 1840 07/15/16 0400  AST 21 61* 57*  ALT 37 <5* 29  ALKPHOS 93 85 56  BILITOT 0.7 0.8 0.7  ALBUMIN 3.8 3.3* 2.2*    Cardiac Enzymes No results for input(s): TROPONINI, PROBNP in the last 168 hours.  Glucose  Recent Labs Lab 07/15/16 1345 07/15/16 1539 07/15/16 1919 07/15/16 2344 07/16/16 0344 07/16/16 0723  GLUCAP 149* 120* 146* 142* 142* 157*    Imaging Dg Chest Port 1 View  Result Date: 07/16/2016 CLINICAL DATA:  Respiratory failure EXAM: PORTABLE CHEST 1 VIEW COMPARISON:  07/15/2016 FINDINGS: Endotracheal tube in good position. Central venous catheter tip in the SVC unchanged. NG tube  in the stomach. Decreased lung volume. Bibasilar atelectasis left greater than right. No edema or effusion. IMPRESSION: Support lines remain in good position. Bibasilar atelectasis similar to yesterday. Electronically Signed   By: Marlan Palauharles  Clark M.D.   On: 07/16/2016 07:55     STUDIES:  CT abd/pelvis >> Recurrent Rt inguinal hernia with SBO  CULTURES: 12/10 bc>> 12/12 sputum>>  ANTIBIOTICS: 12/11 Zosyn >>  SIGNIFICANT EVENTS: 12/10 open lap 12/13 return to OR>>wash out  LINES/TUBES: 12/10 et>> 12/10 lt D[place] cvl>> 12/10 aline>>  DISCUSSION: 45 yo AAM with known rt inguinal that became non reducable 12/5 and he was taken to OR 12/10 for inguinal hernia repair and MESH placement.   Discharged home. He developed first N/V then diarrhea and [facility] was taken to OR for rt groin exploration that turned into open abd lap with rt partial colectomy for damaged bowel. He is in SICU on vent , requiring pressor support and will need to return to OR in near future. PCCM asked to assist in his care.  ASSESSMENT / PLAN:  PULMONARY A: VDRF post lap/sepsis/open abd Mild co2 retention P:   Vent bundle NO wean till abd closed   CARDIOVASCULAR A:  Hypotension/shock. Presumed sepsis from bowel source P:  CVP 7->14 IVF as needed Pressors Follow lactic acid   RENAL Lab Results  Component Value Date   CREATININE 1.10 07/16/2016   CREATININE 1.76 (H) 07/15/2016   CREATININE 1.90 (H) 07/14/2016    A:   Acte renal insuff improving Hypokalemia Lactic acidosis 5.3->3.6->2.7 P:   Follow labs Lytes treatment as needed  GASTROINTESTINAL A:   Post bowel resection 12/10 P:   NPO NGT to suction OR 12/13 for clean out  HEMATOLOGIC A:   No acute issue P:  Follow labs   INFECTIOUS A:   Incarcerated rt inguinal hernia with surgery 12/5 for closure/mesh 12/10 open lap and rt inguinal site opened. Bowel resection 12/10 12/12 spiked fever 105.7 12/14 100.00 P:   Zosyn #3/x, no+ cul data To OR 12/13 explore for possible fever source with clean out and wound open  ENDOCRINE   A:   Hyperglyemia CBG (last 3)   Recent Labs  07/15/16 2344 07/16/16 0344 07/16/16 0723  GLUCAP 142* 142* 157*     P:   Follow glucose on labs 12/13 add SSI  NEUROLOGIC A:   Sedated on vent P:   RASS goal: -1 while intubated and abd open Wean sedation once stable and ready for intubation   FAMILY  - Updates: None at bedside  - Inter-disciplinary family meet or Palliative Care meeting due by: day 7    CCT= 30 min  Brett CanalesSteve Minor ACNP [facility] PCCM Pager [phone] till 3 pm If no answer page [phone] 07/16/2016, 9:06 AM  More alert.  Denies  chest pain.  HR regular.  No wheeze.  Wound vac.  1+ edema.  WBC 18.6, Creatinine 1.10  Assessment/plan:  Acute respiratory failure. - full vent support - decrease RR to 16  Septic shock from incarcerated Rt inguinal hernia with bowel obstruction. - continue Abx - back to OR 12/15 - pressors to keep MAP > 65  AKI >> resolved. Non gap metabolic acidosis > resolved.  Acute metabolic encephalopathy. - change to precedex from diprivan with elevated triglyceride  CC time by me independent of APP time 31 minutes.  Coralyn HellingVineet Rie Mcneil, MD [facility] Pul[facility]/Critical [facility] 07/16/2016, 10:24 AM Pager:  [phone] After 3pm call: [phone]

## 2016-07-16 NOTE — Progress Notes (Signed)
CCS/Quadry Kampa Progress Note 1 Day Post-Op  Subjective: Patient is awake and appropriate on the ventilator.  No acute distress  On a lot of drips to support his BP because he has requires lots of sedation.  Will try to wean down sedation while maintaining pain control  Objective: Vital signs in last 24 hours: Temp:  [98.6 F (37 C)-100.3 F (37.9 C)] 100 F (37.8 C) (12/14 0800) Pulse Rate:  [79-99] 86 (12/14 0900) Resp:  [6-28] 28 (12/14 0900) BP: (78-133)/(56-78) 89/70 (12/14 0900) SpO2:  [87 %-100 %] 95 % (12/14 0900) Arterial Line BP: (60-119)/(35-77) 100/64 (12/14 0900) FiO2 (%):  [35 %] 35 % (12/14 0800) Weight:  [117.1 kg (258 lb 2.5 oz)] 117.1 kg (258 lb 2.5 oz) (12/14 0500) Last BM Date:  (UTA)  Intake/Output from previous day: 12/13 0701 - 12/14 0700 In: 6403.6 [I.V.:4981.1; NG/GT:210; IV Piggyback:1212.5] Out: 5500 [Urine:1150; Emesis/NG output:1850; Drains:575; Blood:25] Intake/Output this shift: Total I/O In: 343.4 [I.V.:313.4; NG/GT:30] Out: 150 [Urine:100; Drains:50]  General: No acute distress.  Lungs: Clear to auscultation.  Abd: Open dressing intact.  Minimal serosanguinous drainage.  Extremities: No changes  Neuro: Intact, sedated.  Lab Results:  @LABLAST2 (wbc:2,hgb:2,hct:2,plt:2) BMET ) Recent Labs  07/15/16 0400 07/16/16 0500  NA 133* 133*  K 3.8 4.1  CL 105 101  CO2 16* 23  GLUCOSE 182* 165*  BUN 48* 30*  CREATININE 1.76* 1.10  CALCIUM 7.0* 7.1*   PT/INR  Recent Labs  07/14/16 0930  LABPROT 18.0*  INR 1.48   ABG  Recent Labs  07/14/16 1059 07/15/16 0414  PHART 7.414 7.433  HCO3 22.8 22.8    Studies/Results: Dg Chest Port 1 View  Result Date: 07/16/2016 CLINICAL DATA:  Respiratory failure EXAM: PORTABLE CHEST 1 VIEW COMPARISON:  07/15/2016 FINDINGS: Endotracheal tube in good position. Central venous catheter tip in the SVC unchanged. NG tube in the stomach. Decreased lung volume. Bibasilar atelectasis left greater than  right. No edema or effusion. IMPRESSION: Support lines remain in good position. Bibasilar atelectasis similar to yesterday. Electronically Signed   By: Marlan Palauharles  Clark M.D.   On: 07/16/2016 07:55   Dg Chest Port 1 View  Result Date: 07/15/2016 CLINICAL DATA:  Recent abdominal surgery, endotracheal tube position EXAM: PORTABLE CHEST 1 VIEW COMPARISON:  portable chest x-ray of 07/14/2016. FINDINGS: The tip of the endotracheal tube is approximately 5.4 cm above the carina. Left basilar linear atelectasis remains. No pneumonia or effusion is seen. Left central venous line is unchanged in position with the tip overlying the lower SVC near the expected right atrial junction. IMPRESSION: 1. Tip of endotracheal tube 5.4 cm above the carina. 2. No change in left basilar linear atelectasis. Electronically Signed   By: Dwyane DeePaul  Barry M.D.   On: 07/15/2016 08:01    Anti-infectives: Anti-infectives    Start     Dose/Rate Route Frequency Ordered Stop   07/13/16 0600  piperacillin-tazobactam (ZOSYN) IVPB 3.375 g     3.375 g 12.5 mL/hr over 240 Minutes Intravenous Every 8 hours 07/13/16 0225     07/12/16 2300  piperacillin-tazobactam (ZOSYN) IVPB 3.375 g     3.375 g 100 mL/hr over 30 Minutes Intravenous  Once 07/12/16 2246 07/12/16 2309   07/12/16 2300  vancomycin (VANCOCIN) 2,000 mg in sodium chloride 0.9 % 500 mL IVPB     2,000 mg 250 mL/hr over 120 Minutes Intravenous  Once 07/12/16 2246 07/13/16 0101      Assessment/Plan: s/p Procedure(s): EXPLORATORY LAPAROTOMY; ILEO-COLONIC ANASTOMOSIS; REAPPLICATION OF WOUND VAC  APPENDECTOMY SMALL BOWEL RESECTION Paln to go back to the OR tomorrow.  Hopefully will go back and be able to close the abdomen fix the hernia  LOS: 3 days   Marta LamasJames O. Gae BonWyatt, III, MD, FACS [phone]--pager [phone]--office [facility] 07/16/2016

## 2016-07-17 ENCOUNTER — Inpatient Hospital Stay (HOSPITAL_COMMUNITY): Payer: Medicaid Other | Admitting: Anesthesiology

## 2016-07-17 ENCOUNTER — Encounter (HOSPITAL_COMMUNITY): Payer: Self-pay | Admitting: Anesthesiology

## 2016-07-17 ENCOUNTER — Encounter (HOSPITAL_COMMUNITY): Admission: EM | Disposition: A | Discharge status: Home Health | Payer: Self-pay | Source: Home / Self Care

## 2016-07-17 DIAGNOSIS — K562 Volvulus: Secondary | ICD-10-CM

## 2016-07-17 HISTORY — PX: LAPAROTOMY: SHX154

## 2016-07-17 LAB — BASIC METABOLIC PANEL
ANION GAP: 7 (ref 5–15)
BUN: 18 mg/dL (ref 6–20)
CHLORIDE: 104 mmol/L (ref 101–111)
CO2: 21 mmol/L — AB (ref 22–32)
Calcium: 7.3 mg/dL — ABNORMAL LOW (ref 8.9–10.3)
Creatinine, Ser: 0.86 mg/dL (ref 0.61–1.24)
GFR calc non Af Amer: >60 mL/min (ref >60)
Glucose, Bld: 112 mg/dL — ABNORMAL HIGH (ref 65–99)
POTASSIUM: 4.1 mmol/L (ref 3.5–5.1)
SODIUM: 132 mmol/L — AB (ref 135–145)

## 2016-07-17 LAB — CBC WITH DIFFERENTIAL/PLATELET
Basophils Absolute: 0 10*3/uL (ref 0.0–0.1)
Basophils Relative: 0 %
EOS PCT: 1 %
Eosinophils Absolute: 0.1 10*3/uL (ref 0.0–0.7)
HEMATOCRIT: 37 % — AB (ref 39.0–52.0)
HEMOGLOBIN: 12.4 g/dL — AB (ref 13.0–17.0)
LYMPHS ABS: 2.6 10*3/uL (ref 0.7–4.0)
LYMPHS PCT: 18 %
MCH: 29 pg (ref 26.0–34.0)
MCHC: 33.5 g/dL (ref 30.0–36.0)
MCV: 86.4 fL (ref 78.0–100.0)
MONOS PCT: 16 %
Monocytes Absolute: 2.3 10*3/uL — ABNORMAL HIGH (ref 0.1–1.0)
NEUTROS PCT: 65 %
Neutro Abs: 9.3 10*3/uL — ABNORMAL HIGH (ref 1.7–7.7)
Platelets: 145 10*3/uL — ABNORMAL LOW (ref 150–400)
RBC: 4.28 MIL/uL (ref 4.22–5.81)
RDW: 15.1 % (ref 11.5–15.5)
WBC: 14.3 10*3/uL — AB (ref 4.0–10.5)

## 2016-07-17 LAB — POCT I-STAT 3, ART BLOOD GAS (G3+)
ACID-BASE DEFICIT: 2 mmol/L (ref 0.0–2.0)
Bicarbonate: 22.3 mmol/L (ref 20.0–28.0)
O2 SAT: 99 %
PH ART: 7.365 (ref 7.350–7.450)
PO2 ART: 129 mmHg — AB (ref 83.0–108.0)
Patient temperature: 100.3
TCO2: 23 mmol/L (ref 0–100)
pCO2 arterial: 39.4 mmHg (ref 32.0–48.0)

## 2016-07-17 LAB — GLUCOSE, CAPILLARY
GLUCOSE-CAPILLARY: 103 mg/dL — AB (ref 65–99)
GLUCOSE-CAPILLARY: 86 mg/dL (ref 65–99)
GLUCOSE-CAPILLARY: 97 mg/dL (ref 65–99)
GLUCOSE-CAPILLARY: 101 mg/dL — AB (ref 65–99)
Glucose-Capillary: 80 mg/dL (ref 65–99)
Glucose-Capillary: 119 mg/dL — ABNORMAL HIGH (ref 65–99)

## 2016-07-17 LAB — CULTURE, RESPIRATORY W GRAM STAIN: Special Requests: NORMAL

## 2016-07-17 LAB — CULTURE, BLOOD (ROUTINE X 2)
CULTURE: NO GROWTH
Culture: NO GROWTH

## 2016-07-17 LAB — LACTIC ACID, PLASMA: LACTIC ACID, VENOUS: 1.2 mmol/L (ref 0.5–1.9)

## 2016-07-17 LAB — CULTURE, RESPIRATORY

## 2016-07-17 SURGERY — LAPAROTOMY, EXPLORATORY
Anesthesia: General | Site: Abdomen

## 2016-07-17 MED ORDER — POVIDONE-IODINE 10 % OINT PACKET
TOPICAL_OINTMENT | CUTANEOUS | Status: DC | PRN
  Administered 2016-07-17: 1 via TOPICAL

## 2016-07-17 MED ORDER — FENTANYL CITRATE (PF) 100 MCG/2ML IJ SOLN
INTRAMUSCULAR | Status: AC
  Filled 2016-07-17: qty 2

## 2016-07-17 MED ORDER — ROCURONIUM BROMIDE 100 MG/10ML IV SOLN
INTRAVENOUS | Status: DC | PRN
Start: 2016-07-17 — End: 2016-07-17
  Administered 2016-07-17: 50 mg via INTRAVENOUS
  Administered 2016-07-17: 30 mg via INTRAVENOUS
  Administered 2016-07-17: 20 mg via INTRAVENOUS

## 2016-07-17 MED ORDER — POVIDONE-IODINE 10 % EX OINT
TOPICAL_OINTMENT | CUTANEOUS | Status: AC
  Filled 2016-07-17: qty 28.35

## 2016-07-17 MED ORDER — SODIUM CHLORIDE 0.9 % IV SOLN
INTRAVENOUS | Status: DC | PRN
  Administered 2016-07-17: 13:00:00 via INTRAVENOUS

## 2016-07-17 MED ORDER — ROCURONIUM BROMIDE 50 MG/5ML IV SOSY
PREFILLED_SYRINGE | INTRAVENOUS | Status: AC
  Filled 2016-07-17: qty 10

## 2016-07-17 MED ORDER — PROPOFOL 10 MG/ML IV BOLUS
INTRAVENOUS | Status: AC
  Filled 2016-07-17: qty 20

## 2016-07-17 MED ORDER — PROPOFOL 10 MG/ML IV BOLUS
INTRAVENOUS | Status: DC | PRN
  Administered 2016-07-17: 60 mg via INTRAVENOUS

## 2016-07-17 MED ORDER — SODIUM CHLORIDE 0.9 % IV SOLN
INTRAVENOUS | Status: DC
  Administered 2016-07-17 – 2016-07-23 (×2): via INTRAVENOUS

## 2016-07-17 MED ORDER — 0.9 % SODIUM CHLORIDE (POUR BTL) OPTIME
TOPICAL | Status: DC | PRN
  Administered 2016-07-17: 1000 mL
  Administered 2016-07-17: 2000 mL

## 2016-07-17 MED ORDER — FENTANYL CITRATE (PF) 100 MCG/2ML IJ SOLN
INTRAMUSCULAR | Status: DC | PRN
  Administered 2016-07-17 (×2): 50 ug via INTRAVENOUS

## 2016-07-17 SURGICAL SUPPLY — 41 items
BLADE SURG ROTATE 9660 (MISCELLANEOUS) IMPLANT
BRR ADH 5X3 SEPRAFILM 6 SHT (MISCELLANEOUS)
CANISTER SUCTION 2500CC (MISCELLANEOUS) ×3 IMPLANT
CHLORAPREP W/TINT 26ML (MISCELLANEOUS) ×3 IMPLANT
COVER SURGICAL LIGHT HANDLE (MISCELLANEOUS) ×3 IMPLANT
DRAPE LAPAROSCOPIC ABDOMINAL (DRAPES) ×3 IMPLANT
DRAPE WARM FLUID 44X44 (DRAPE) ×3 IMPLANT
DRSG OPSITE POSTOP 4X10 (GAUZE/BANDAGES/DRESSINGS) IMPLANT
DRSG OPSITE POSTOP 4X8 (GAUZE/BANDAGES/DRESSINGS) IMPLANT
DRSG VAC ATS LRG SENSATRAC (GAUZE/BANDAGES/DRESSINGS) ×2 IMPLANT
ELECT BLADE 6.5 EXT (BLADE) IMPLANT
ELECT CAUTERY BLADE 6.4 (BLADE) ×3 IMPLANT
ELECT REM PT RETURN 9FT ADLT (ELECTROSURGICAL) ×3
ELECTRODE REM PT RTRN 9FT ADLT (ELECTROSURGICAL) ×1 IMPLANT
GLOVE BIOGEL PI IND STRL 8 (GLOVE) ×1 IMPLANT
GLOVE BIOGEL PI INDICATOR 8 (GLOVE) ×2
GLOVE ECLIPSE 7.5 STRL STRAW (GLOVE) ×3 IMPLANT
GOWN STRL REUS W/ TWL LRG LVL3 (GOWN DISPOSABLE) ×2 IMPLANT
GOWN STRL REUS W/TWL LRG LVL3 (GOWN DISPOSABLE) ×6
KIT BASIN OR (CUSTOM PROCEDURE TRAY) ×3 IMPLANT
KIT ROOM TURNOVER OR (KITS) ×3 IMPLANT
LIGASURE IMPACT 36 18CM CVD LR (INSTRUMENTS) IMPLANT
NS IRRIG 1000ML POUR BTL (IV SOLUTION) ×6 IMPLANT
PACK GENERAL/GYN (CUSTOM PROCEDURE TRAY) ×3 IMPLANT
PAD ARMBOARD 7.5X6 YLW CONV (MISCELLANEOUS) ×3 IMPLANT
SEPRAFILM PROCEDURAL PACK 3X5 (MISCELLANEOUS) IMPLANT
SPECIMEN JAR LARGE (MISCELLANEOUS) IMPLANT
SPONGE GAUZE 4X4 12PLY STER LF (GAUZE/BANDAGES/DRESSINGS) ×2 IMPLANT
SPONGE LAP 18X18 X RAY DECT (DISPOSABLE) IMPLANT
STAPLER VISISTAT 35W (STAPLE) ×3 IMPLANT
SUCTION POOLE TIP (SUCTIONS) ×3 IMPLANT
SUT NOVA 1 T20/GS 25DT (SUTURE) ×10 IMPLANT
SUT PDS AB 1 TP1 96 (SUTURE) ×6 IMPLANT
SUT SILK 2 0 SH CR/8 (SUTURE) ×3 IMPLANT
SUT SILK 2 0 TIES 10X30 (SUTURE) ×3 IMPLANT
SUT SILK 3 0 SH CR/8 (SUTURE) ×3 IMPLANT
SUT SILK 3 0 TIES 10X30 (SUTURE) ×3 IMPLANT
TAPE CLOTH SURG 6X10 WHT LF (GAUZE/BANDAGES/DRESSINGS) ×2 IMPLANT
TOWEL OR 17X26 10 PK STRL BLUE (TOWEL DISPOSABLE) ×3 IMPLANT
TRAY FOLEY CATH 16FRSI W/METER (SET/KITS/TRAYS/PACK) IMPLANT
YANKAUER SUCT BULB TIP NO VENT (SUCTIONS) IMPLANT

## 2016-07-17 NOTE — Transfer of Care (Signed)
Immediate Anesthesia Transfer of Care Note  Patient: Travis Gould  Procedure(s) Performed: Procedure(s): EXPLORATORY LAPAROTOMY, PRIMARY REPAIR OF RIGHT INGUINAL HERNIA , CLOSURE OF ABDOMEN (N/A)  Patient Location: SICU  Anesthesia Type:General  Level of Consciousness: sedated and Patient remains intubated per anesthesia plan  Airway & Oxygen Therapy: Patient remains intubated per anesthesia plan and Patient placed on Ventilator (see vital sign flow sheet for setting)  Post-op Assessment: Report given to RN and Post -op Vital signs reviewed and stable  Post vital signs: Reviewed and stable  Last Vitals:  BP 107/70 HR 74 RR 13 (see vent flow sheet) SpO2 100%   Complications: No apparent anesthesia complications

## 2016-07-17 NOTE — Progress Notes (Signed)
CCS/Charma Mocarski Progress Note 2 Days Post-Op  Subjective: Patient switched to Precedex because of triglycerides.  Seems to be doing better.  Could not back off too much on sedation.  Pressors overall are down.  Objective: Vital signs in last 24 hours: Temp:  [99.8 F (37.7 C)-100.6 F (38.1 C)] 100.1 F (37.8 C) (12/15 0400) Pulse Rate:  [73-114] 73 (12/15 0600) Resp:  [13-30] 16 (12/15 0600) BP: (77-109)/(57-77) 94/73 (12/15 0600) SpO2:  [93 %-100 %] 100 % (12/15 0600) Arterial Line BP: (82-163)/(53-70) 109/66 (12/15 0600) FiO2 (%):  [35 %] 35 % (12/15 0400) Last BM Date:  (UTA)  Intake/Output from previous day: 12/14 0701 - 12/15 0700 In: 4974.2 [I.V.:3581.7; NG/GT:180; IV Piggyback:1212.5] Out: 2465 [Urine:1015; Emesis/NG output:550; Drains:900] Intake/Output this shift: No intake/output data recorded.  General: Sedated and calm.  No acute distress  Lungs: Clear to auscultation.  ABG is good.  Abd: Open NPWD, draining serous fluid  Extremities: No changes  Neuro: Intact when sedation is light.  Follows commands.  Lab Results:  @LABLAST2 (wbc:2,hgb:2,hct:2,plt:2) BMET ) Recent Labs  07/16/16 0500 07/17/16 0500  NA 133* 132*  K 4.1 4.1  CL 101 104  CO2 23 21*  GLUCOSE 165* 112*  BUN 30* 18  CREATININE 1.10 0.86  CALCIUM 7.1* 7.3*   PT/INR  Recent Labs  07/14/16 0930  LABPROT 18.0*  INR 1.48   ABG  Recent Labs  07/15/16 0414 07/17/16 0534  PHART 7.433 7.365  HCO3 22.8 22.3    Studies/Results: Dg Chest Port 1 View  Result Date: 07/16/2016 CLINICAL DATA:  Respiratory failure EXAM: PORTABLE CHEST 1 VIEW COMPARISON:  07/15/2016 FINDINGS: Endotracheal tube in good position. Central venous catheter tip in the SVC unchanged. NG tube in the stomach. Decreased lung volume. Bibasilar atelectasis left greater than right. No edema or effusion. IMPRESSION: Support lines remain in good position. Bibasilar atelectasis similar to yesterday. Electronically Signed    By: Marlan Palauharles  Clark M.D.   On: 07/16/2016 07:55    Anti-infectives: Anti-infectives    Start     Dose/Rate Route Frequency Ordered Stop   07/13/16 0600  piperacillin-tazobactam (ZOSYN) IVPB 3.375 g     3.375 g 12.5 mL/hr over 240 Minutes Intravenous Every 8 hours 07/13/16 0225     07/12/16 2300  piperacillin-tazobactam (ZOSYN) IVPB 3.375 g     3.375 g 100 mL/hr over 30 Minutes Intravenous  Once 07/12/16 2246 07/12/16 2309   07/12/16 2300  vancomycin (VANCOCIN) 2,000 mg in sodium chloride 0.9 % 500 mL IVPB     2,000 mg 250 mL/hr over 120 Minutes Intravenous  Once 07/12/16 2246 07/13/16 0101      Assessment/Plan: s/p Procedure(s): EXPLORATORY LAPAROTOMY; ILEO-COLONIC ANASTOMOSIS; REAPPLICATION OF WOUND VAC APPENDECTOMY SMALL BOWEL RESECTION Back to the OR today for attempted closure.  LOS: 4 days   Marta LamasJames O. Gae BonWyatt, III, MD, FACS [phone]--pager [phone]--office [facility] 07/17/2016

## 2016-07-17 NOTE — Care Management Note (Signed)
Case Management Note  Patient Details  Name: Travis Gould MRN: [id number] Date of Birth: 12/28/1970  Subjective/Objective:       Pt admitted with incarcerated Rt inguinal hernia repair             Action/Plan:  PTA from home with girlfriend, CM will continue to follow   Expected Discharge Date:                  Expected Discharge Plan:     In-House Referral:     Discharge planning Services  CM Consult  Post Acute Care Choice:    Choice offered to:     DME Arranged:    DME Agency:     HH Arranged:    HH Agency:     Status of Service:  In process, will continue to follow  If discussed at Long Length of Stay Meetings, dates discussed:    Additional Comments:  Cherylann ParrClaxton, Avaley Coop S, RN 07/17/2016, 11:54 AM

## 2016-07-17 NOTE — Anesthesia Preprocedure Evaluation (Addendum)
Anesthesia Evaluation  Patient identified by MRN, date of birth, ID band  Reviewed: Allergy & Precautions, NPO status , Patient's Chart, lab work & pertinent test results, Unable to perform ROS - Chart review only  Airway Mallampati: Intubated      Comment: Patient intubated. Prior easy intubation with [employer]   Pulmonary     + decreased breath sounds      Cardiovascular  Rhythm:Regular Rate:Normal     Neuro/Psych    GI/Hepatic   Endo/Other  Morbid obesity  Renal/GU      Musculoskeletal   Abdominal (+) + obese,   Peds  Hematology   Anesthesia Other Findings Open abdomen s/p incarcerated hernia  Reproductive/Obstetrics                           Anesthesia Physical Anesthesia Plan  ASA: III  Anesthesia Plan: General   Post-op Pain Management:    Induction: Intravenous  Airway Management Planned: Oral ETT  Additional Equipment:   Intra-op Plan:   Post-operative Plan: Post-operative intubation/ventilation  Informed Consent: I have reviewed the patients History and Physical, chart, labs and discussed the procedure including the risks, benefits and alternatives for the proposed anesthesia with the patient or authorized representative who has indicated his/her understanding and acceptance.     Plan Discussed with: CRNA and Anesthesiologist  Anesthesia Plan Comments:         Anesthesia Quick Evaluation

## 2016-07-17 NOTE — Progress Notes (Signed)
PCCM Progress Note    Admission date:  07/12/2016 Consult date:  07/14/2016 Referring provider: Dr. Lindie SpruceWyatt, CCS  CC:  Abdominal pain  HPI:   45 yo male had incarcerated Rt inguinal hernia repair with MESH 12/05 and d/c home.  Developed n/v/d 12/10 and readmitted.  Found to have strangulation of terminal ileum and bowel obstruction requiring laparotomy.  Complicated by septic shock, VDRF.  Subjective:  No fever.  Remains on pressors  Vital signs: BP 93/66   Pulse 91   Temp 100 F (37.8 C)   Resp 20   Ht 5\' 11"  (1.803 m)   Wt 258 lb 2.5 oz (117.1 kg)   SpO2 (!) 88%   BMI 36.01 kg/m   Hemodynamics: CVP:  [12 mmHg-16 mmHg] 12 mmHg  Intake/output: I/O last 3 completed shifts: In: 7135 [I.V.:5540; NG/GT:270; IV Piggyback:1325] Out: 4240 [Urine:1740; Emesis/NG output:1350; Drains:1150]  General:  sedated Neuro: follows commands with WUA HEENT: ETT in place Cardiovascular: regular, no murmur Lungs: no wheeze Abdomen:  Open abd wound with vac in place Musculoskeletal: no edema Skin: no rashes  CMP Latest Ref Rng & Units 07/17/2016 07/16/2016 07/15/2016  Glucose 65 - 99 mg/dL 409(W112(H) 119(J165(H) 478(G182(H)  BUN 6 - 20 mg/dL 18 95(A30(H) 21(H48(H)  Creatinine 0.61 - 1.24 mg/dL 0.860.86 5.781.10 4.69(G1.76(H)  Sodium 135 - 145 mmol/L 132(L) 133(L) 133(L)  Potassium 3.5 - 5.1 mmol/L 4.1 4.1 3.8  Chloride 101 - 111 mmol/L 104 101 105  CO2 22 - 32 mmol/L 21(L) 23 16(L)  Calcium 8.9 - 10.3 mg/dL 7.3(L) 7.1(L) 7.0(L)  Total Protein 6.5 - 8.1 g/dL - - 5.6(L)  Total Bilirubin 0.3 - 1.2 mg/dL - - 0.7  Alkaline Phos 38 - 126 U/L - - 56  AST 15 - 41 U/L - - 57(H)  ALT 17 - 63 U/L - - 29    CBC Latest Ref Rng & Units 07/17/2016 07/16/2016 07/15/2016  WBC 4.0 - 10.5 K/uL 14.3(H) 18.6(H) 15.6(H)  Hemoglobin 13.0 - 17.0 g/dL 12.4(L) 13.4 15.0  Hematocrit 39.0 - 52.0 % 37.0(L) 39.3 43.5  Platelets 150 - 400 K/uL 145(L) 175 258    ABG    Component Value Date/Time   PHART 7.365 07/17/2016 0534   PCO2ART  39.4 07/17/2016 0534   PO2ART 129.0 (H) 07/17/2016 0534   HCO3 22.3 07/17/2016 0534   TCO2 23 07/17/2016 0534   ACIDBASEDEF 2.0 07/17/2016 0534   O2SAT 99.0 07/17/2016 0534    CBG (last 3)   Recent Labs  07/16/16 2338 07/17/16 0352 07/17/16 0737  GLUCAP 93 103* 86     Imaging: Dg Chest Port 1 View  Result Date: 07/16/2016 CLINICAL DATA:  Respiratory failure EXAM: PORTABLE CHEST 1 VIEW COMPARISON:  07/15/2016 FINDINGS: Endotracheal tube in good position. Central venous catheter tip in the SVC unchanged. NG tube in the stomach. Decreased lung volume. Bibasilar atelectasis left greater than right. No edema or effusion. IMPRESSION: Support lines remain in good position. Bibasilar atelectasis similar to yesterday. Electronically Signed   By: Marlan Palauharles  Clark M.D.   On: 07/16/2016 07:55     Studies:  CT abd/pelvis 12/10 >> Recurrent Rt inguinal hernia with SBO  Cultures: Blood 12/10 >> Sputum 12/12 >> E coli  Antibiotics: 12/11 Zosyn >>  Events: 12/10 open lap 12/12 Fever 105 12/13 return to OR >> wash out  Lines/tubes: ETT 12/10 >> Lt [place] CVL 12/10 >> Rt radial aline 12/10 >>  ASSESSMENT / PLAN:  Strangulated SB after repair of incarcerated right inguinal  hernia. - plan to go to OR 12/15 for closure  Septic shock with peritonitis and E coli tracheobronchitis. - Day 5 of Abx - pressors to keep MAP > 65  Acute respiratory failure with hypoxia. - full vent support until more stable  Hyperglycemia. - SSI  Acute metabolic encephalopathy. - RASS goal -1 - changed to precedex >> hypertriglyceridemia with diprivan  DVT prophylaxis - SQ heparin, SCDs SUP - Pepcid Nutrition - NPO  Goals of care - full code  Resolved problems >> AKI, lactic acidosis  CC time 34 minutes.  Coralyn HellingVineet Ravynn Hogate, MD [facility] Pulmonary/Critical Care 07/17/2016, 11:12 AM Pager:  [phone] After 3pm call: [phone]

## 2016-07-17 NOTE — Anesthesia Postprocedure Evaluation (Signed)
Anesthesia Post Note  Patient: Travis Gould  Procedure(s) Performed: Procedure(s) (LRB): EXPLORATORY LAPAROTOMY, PRIMARY REPAIR OF RIGHT INGUINAL HERNIA , CLOSURE OF ABDOMEN (N/A)  Patient location during evaluation: SICU Anesthesia Type: General Level of consciousness: sedated and patient remains intubated per anesthesia plan Pain management: pain level controlled Vital Signs Assessment: post-procedure vital signs reviewed and stable Respiratory status: patient on ventilator - see flowsheet for VS and patient remains intubated per anesthesia plan Cardiovascular status: blood pressure returned to baseline Anesthetic complications: no    Last Vitals:  Vitals:   07/17/16 1545 07/17/16 1600  BP:  113/84  Pulse: 84 84  Resp: 17 17  Temp:      Last Pain:  Vitals:   07/17/16 0400  TempSrc: Rectal  PainSc:                  Kristin Barcus COKER

## 2016-07-17 NOTE — Op Note (Signed)
OPERATIVE REPORT  DATE OF OPERATION:  07/17/2016  PATIENT:  Adah PerlShane Q Belding  45 y.o. male  PRE-OPERATIVE DIAGNOSIS:  Open abdomen with ischemic bowel  POST-OPERATIVE DIAGNOSIS:  Open abdomen with ischemic bowel, recurrent RIH  INDICATION(S) FOR OPERATION:  Patient developed recurrent hernia with incarceration and strangulation requiring SB resection and open abdomen  FINDINGS:  Bowel was viable.  Hernia defect in the RLQ with hernia sac into the peritoneum  PROCEDURE:  Procedure(s): EXPLORATORY LAPAROTOMY, PRIMARY REPAIR OF RIGHT INGUINAL HERNIA , CLOSURE OF ABDOMEN  SURGEON:  Surgeon(s): Jimmye NormanJames Humna Moorehouse, MD Violeta GelinasBurke Thompson, MD  ASSISTANT: Janee Mornhompson, M.D.  ANESTHESIA:   general  COMPLICATIONS:  None  EBL: 20 ml  BLOOD ADMINISTERED: none  DRAINS: Nasogastric Tube, Urinary Catheter (Foley) and Midline subcutaneous NPWD   SPECIMEN:  No Specimen  COUNTS CORRECT:  YES  PROCEDURE DETAILS: The patient was taken to the operating room and transferred directly to the operating table from his intensive care unit bed with an open abdomen and on the ventilator.  A proper timeout was performed identifying the patient and the procedure to be performed. His negative pressure wound dressing was removed leaving the inner portion which was subsequently included in the preparatory part of the procedure.  We removed the inner portion of the negative pressure wound dressing explored the abdomen gently taking care not to tear the mesentery and cause possible ischemia. We were able to examine the small bowel from the ligament of Treitz all the way down to the terminal ileum found there to be no evidence of leakage or ischemia. We therefore went ahead and closed the abdomen using interrupted simple stitches of #1 Novafil. This is after irrigating the peritoneal cavity with saline.  After closing the midline abdomen the negative pressure wound dressing was placed in subcutaneous tissue. We explored the  right inguinal area and attempted to perform a non-mesh repair of the right inguinal hernia by bringing down the transversalis fashion and part of the conjoined tendon to the reflected portion of the inguinal ligament. However the tissue planes were 2 edematous and immobile and the only thing we could do was sutured closed the hernia sac at its base preventing peritoneal leakage and possible herniation of bowel. Once this was done we irrigated with saline and closed with staples. All needle counts, sponge counts, and instrument counts were correct.  PATIENT DISPOSITION:  ICU - intubated and critically ill.   Nelda Luckey 12/15/20172:40 PM

## 2016-07-18 ENCOUNTER — Inpatient Hospital Stay (HOSPITAL_COMMUNITY): Payer: Medicaid Other

## 2016-07-18 LAB — GLUCOSE, CAPILLARY
GLUCOSE-CAPILLARY: 109 mg/dL — AB (ref 65–99)
GLUCOSE-CAPILLARY: 93 mg/dL (ref 65–99)
Glucose-Capillary: 112 mg/dL — ABNORMAL HIGH (ref 65–99)
Glucose-Capillary: 109 mg/dL — ABNORMAL HIGH (ref 65–99)
Glucose-Capillary: 110 mg/dL — ABNORMAL HIGH (ref 65–99)

## 2016-07-18 LAB — CBC
HEMATOCRIT: 37.9 % — AB (ref 39.0–52.0)
HEMOGLOBIN: 12.2 g/dL — AB (ref 13.0–17.0)
MCH: 28.6 pg (ref 26.0–34.0)
MCHC: 32.2 g/dL (ref 30.0–36.0)
MCV: 88.8 fL (ref 78.0–100.0)
Platelets: 123 10*3/uL — ABNORMAL LOW (ref 150–400)
RBC: 4.27 MIL/uL (ref 4.22–5.81)
RDW: 15.4 % (ref 11.5–15.5)
WBC: 12.9 10*3/uL — ABNORMAL HIGH (ref 4.0–10.5)

## 2016-07-18 LAB — BASIC METABOLIC PANEL
ANION GAP: 8 (ref 5–15)
BUN: 20 mg/dL (ref 6–20)
CHLORIDE: 105 mmol/L (ref 101–111)
CO2: 22 mmol/L (ref 22–32)
Calcium: 7.4 mg/dL — ABNORMAL LOW (ref 8.9–10.3)
Creatinine, Ser: 0.81 mg/dL (ref 0.61–1.24)
GFR calc Af Amer: >60 mL/min (ref >60)
GFR calc non Af Amer: >60 mL/min (ref >60)
GLUCOSE: 107 mg/dL — AB (ref 65–99)
POTASSIUM: 4.5 mmol/L (ref 3.5–5.1)
Sodium: 135 mmol/L (ref 135–145)

## 2016-07-18 LAB — POCT I-STAT 3, ART BLOOD GAS (G3+)
Acid-base deficit: 3 mmol/L — ABNORMAL HIGH (ref 0.0–2.0)
BICARBONATE: 23.1 mmol/L (ref 20.0–28.0)
O2 Saturation: 97 %
PH ART: 7.329 — AB (ref 7.350–7.450)
PO2 ART: 100 mmHg (ref 83.0–108.0)
Patient temperature: 100
TCO2: 24 mmol/L (ref 0–100)
pCO2 arterial: 44.2 mmHg (ref 32.0–48.0)

## 2016-07-18 MED ORDER — HALOPERIDOL LACTATE 5 MG/ML IJ SOLN
5.0000 mg | INTRAMUSCULAR | Status: AC
  Administered 2016-07-18: 5 mg via INTRAVENOUS
  Filled 2016-07-18: qty 1

## 2016-07-18 MED ORDER — SODIUM CHLORIDE 0.9% FLUSH
10.0000 mL | Freq: Two times a day (BID) | INTRAVENOUS | Status: DC
  Administered 2016-07-19: 10 mL

## 2016-07-18 MED ORDER — ORAL CARE MOUTH RINSE
15.0000 mL | Freq: Two times a day (BID) | OROMUCOSAL | Status: DC
  Administered 2016-07-21 – 2016-07-24 (×6): 15 mL via OROMUCOSAL

## 2016-07-18 MED ORDER — CHLORHEXIDINE GLUCONATE 0.12 % MT SOLN
15.0000 mL | Freq: Two times a day (BID) | OROMUCOSAL | Status: DC
  Administered 2016-07-20 – 2016-07-24 (×6): 15 mL via OROMUCOSAL
  Filled 2016-07-18 (×5): qty 15

## 2016-07-18 MED ORDER — SODIUM CHLORIDE 0.9% FLUSH
10.0000 mL | INTRAVENOUS | Status: DC | PRN
  Administered 2016-07-18: 10 mL
  Filled 2016-07-18: qty 40

## 2016-07-18 NOTE — Progress Notes (Signed)
eLink Physician-Brief Progress Note Patient Name: Travis Gould DOB: 03/07/1971 MRN: [id number]   Date of Service  07/18/2016  HPI/Events of Note  Pt self extubated.  Will watch for now  eICU Interventions  See orders for ABG     Intervention Category Major Interventions: Respiratory failure - evaluation and management  Shan Levansatrick Nakeshia Waldeck 07/18/2016, 4:47 PM

## 2016-07-18 NOTE — Progress Notes (Addendum)
1 Day Post-Op  Subjective: Some agitation this am and started draining serous fluid from Rt inguinal incision. Still on some pressors  Objective: Vital signs in last 24 hours: Temp:  [98.8 F (37.1 C)-100.4 F (38 C)] 98.9 F (37.2 C) (12/16 0730) Pulse Rate:  [62-94] 70 (12/16 0815) Resp:  [0-26] 16 (12/16 0815) BP: (74-120)/(56-84) 95/61 (12/16 0800) SpO2:  [91 %-99 %] 97 % (12/16 0815) Arterial Line BP: (74-167)/(47-96) 110/54 (12/16 0815) FiO2 (%):  [35 %-40 %] 35 % (12/16 0730) Last BM Date:  (PTA)  Intake/Output from previous day: 12/15 0701 - 12/16 0700 In: 4253.3 [I.V.:3853.3; NG/GT:150; IV Piggyback:250] Out: 2275 [Urine:925; Emesis/NG output:950; Drains:300; Blood:100] Intake/Output this shift: Total I/O In: 220.4 [I.V.:160.4; Other:30; NG/GT:30] Out: 180 [Urine:100; Emesis/NG output:80]  Intubated, sedated on fentanyl/precedex Soft, not really distended, wound vac intact Rt inguinal incision - no cellulitis, no induration/swelling, spontaneously draining ascites  Lab Results:   Recent Labs  07/17/16 0500 07/18/16 0421  WBC 14.3* 12.9*  HGB 12.4* 12.2*  HCT 37.0* 37.9*  PLT 145* 123*   BMET  Recent Labs  07/17/16 0500 07/18/16 0421  NA 132* 135  K 4.1 4.5  CL 104 105  CO2 21* 22  GLUCOSE 112* 107*  BUN 18 20  CREATININE 0.86 0.81  CALCIUM 7.3* 7.4*   PT/INR No results for input(s): LABPROT, INR in the last 72 hours. ABG  Recent Labs  07/17/16 0534  PHART 7.365  HCO3 22.3    Studies/Results: Dg Chest Port 1 View  Result Date: 07/18/2016 CLINICAL DATA:  Respiratory failure, shortness of Breath EXAM: PORTABLE CHEST 1 VIEW COMPARISON:  07/16/2016 FINDINGS: Support devices are stable. Low lung volumes with bibasilar opacities, similar prior study. No visible effusions. Heart is borderline in size, likely accentuated by the low volumes. IMPRESSION: Low lung volumes with bibasilar atelectasis or infiltrates. No real change. Electronically  Signed   By: Charlett NoseKevin  Dover M.D.   On: 07/18/2016 07:50    Anti-infectives: Anti-infectives    Start     Dose/Rate Route Frequency Ordered Stop   07/13/16 0600  piperacillin-tazobactam (ZOSYN) IVPB 3.375 g     3.375 g 12.5 mL/hr over 240 Minutes Intravenous Every 8 hours 07/13/16 0225     07/12/16 2300  piperacillin-tazobactam (ZOSYN) IVPB 3.375 g     3.375 g 100 mL/hr over 30 Minutes Intravenous  Once 07/12/16 2246 07/12/16 2309   07/12/16 2300  vancomycin (VANCOCIN) 2,000 mg in sodium chloride 0.9 % 500 mL IVPB     2,000 mg 250 mL/hr over 120 Minutes Intravenous  Once 07/12/16 2246 07/13/16 0101      Assessment/Plan: Recurrent right inguinal hernia, strangulation of terminal ileum, bowel obstruction s/p RIH repair of incarcerated hernia, insertion of Mesh  07/07/16, Dr. Violeta GelinasBurke Thompson S/p exploration of right groin, reduction of right inguinal hernia, resection of small bowel, placement of 200cm^2 negative pressure dressing; 07/13/16, Dr. Feliciana RossettiLuke Kinsinger Exploratory laparotomy/ ileocecectomy, appendectomy - 07/15/16 Dr Lindie SpruceWyatt s/p Procedure(s): EXPLORATORY LAPAROTOMY, PRIMARY REPAIR OF RIGHT INGUINAL HERNIA , CLOSURE OF ABDOMEN (N/A) 07/17/16 Dr Joan FloresWyatt  Wean pressors as tolerated Cont bowel rest, ng tube decompression His Right inguinal hernia site - only had sac closure followed by skin closure. Couldn't re-approximate muscle so not surprised he is having serous drainage from incision. Change dry gauze as needed; could try Eakin pouch   Mary SellaEric M. Andrey CampanileWilson, MD, FACS General, Bariatric, & Minimally Invasive Surgery [facility]   LOS: 5 days    [facility]Ly Wass  M 07/18/2016

## 2016-07-18 NOTE — Progress Notes (Signed)
PCCM Progress Note    Admission date:  07/12/2016 Consult date:  07/14/2016 Referring provider: Dr. Lindie SpruceWyatt, CCS  CC:  Abdominal pain  HPI:   45 yo male had incarcerated Rt inguinal hernia repair with MESH 12/05 and d/c home.  Developed n/v/d 12/10 and readmitted.  Found to have strangulation of terminal ileum and bowel obstruction requiring laparotomy.  Complicated by septic shock, VDRF.  Subjective:  Back to OR 12/15 for closure of abd, repair of R inguinal hernia.  Fascia closed but skin remains open.  Increased agitation this am with bleeding/?opening of R inguinal hernia repair site.   On precedex, fent gtt  Vital signs: BP 95/61   Pulse 70   Temp 98.9 F (37.2 C) (Rectal)   Resp 16   Ht 5\' 11"  (1.803 m)   Wt 117.1 kg (258 lb 2.5 oz)   SpO2 97%   BMI 36.01 kg/m   Hemodynamics: CVP:  [9 mmHg-14 mmHg] 9 mmHg  Intake/output: I/O last 3 completed shifts: In: 6353.5 [I.V.:5751; NG/GT:240; IV Piggyback:362.5] Out: 3540 [Urine:1540; Emesis/NG output:1150; Drains:750; Blood:100]  General:  Sedated on vent  Neuro: sedated  HEENT: ETT in place Cardiovascular: regular, no murmur Lungs: resps even non labored on vent, diminished bases, no wheeze Abdomen:  Midline open abd with VAC, R groin staples intact, site oozing Musculoskeletal: no edema Skin: no rashes  CMP Latest Ref Rng & Units 07/18/2016 07/17/2016 07/16/2016  Glucose 65 - 99 mg/dL 409(W107(H) 119(J112(H) 478(G165(H)  BUN 6 - 20 mg/dL 20 18 95(A30(H)  Creatinine 0.61 - 1.24 mg/dL 2.130.81 0.860.86 5.781.10  Sodium 135 - 145 mmol/L 135 132(L) 133(L)  Potassium 3.5 - 5.1 mmol/L 4.5 4.1 4.1  Chloride 101 - 111 mmol/L 105 104 101  CO2 22 - 32 mmol/L 22 21(L) 23  Calcium 8.9 - 10.3 mg/dL 7.4(L) 7.3(L) 7.1(L)  Total Protein 6.5 - 8.1 g/dL - - -  Total Bilirubin 0.3 - 1.2 mg/dL - - -  Alkaline Phos 38 - 126 U/L - - -  AST 15 - 41 U/L - - -  ALT 17 - 63 U/L - - -    CBC Latest Ref Rng & Units 07/18/2016 07/17/2016 07/16/2016  WBC 4.0 - 10.5  K/uL 12.9(H) 14.3(H) 18.6(H)  Hemoglobin 13.0 - 17.0 g/dL 12.2(L) 12.4(L) 13.4  Hematocrit 39.0 - 52.0 % 37.9(L) 37.0(L) 39.3  Platelets 150 - 400 K/uL 123(L) 145(L) 175    ABG    Component Value Date/Time   PHART 7.365 07/17/2016 0534   PCO2ART 39.4 07/17/2016 0534   PO2ART 129.0 (H) 07/17/2016 0534   HCO3 22.3 07/17/2016 0534   TCO2 23 07/17/2016 0534   ACIDBASEDEF 2.0 07/17/2016 0534   O2SAT 99.0 07/17/2016 0534    CBG (last 3)   Recent Labs  07/17/16 2316 07/18/16 0350 07/18/16 0811  GLUCAP 119* 112* 109*     Imaging: Dg Chest Port 1 View  Result Date: 07/18/2016 CLINICAL DATA:  Respiratory failure, shortness of Breath EXAM: PORTABLE CHEST 1 VIEW COMPARISON:  07/16/2016 FINDINGS: Support devices are stable. Low lung volumes with bibasilar opacities, similar prior study. No visible effusions. Heart is borderline in size, likely accentuated by the low volumes. IMPRESSION: Low lung volumes with bibasilar atelectasis or infiltrates. No real change. Electronically Signed   By: Charlett NoseKevin  Dover M.D.   On: 07/18/2016 07:50     Studies:  CT abd/pelvis 12/10 >> Recurrent Rt inguinal hernia with SBO  Cultures: Blood 12/10 >>NEG Sputum 12/12 >> E coli  Antibiotics:  12/11 Zosyn >>  Events: 12/10 open lap 12/12 Fever 105 12/13 return to OR >> wash out 12/15>> OR, abd closure, repair R inguinal hernia   Lines/tubes: ETT 12/10 >> Lt [place] CVL 12/10 >> Rt radial aline 12/10 >>  ASSESSMENT / PLAN:  Strangulated SB after repair of incarcerated right inguinal hernia. - per surgery  - s/p abd fascia closure 12/15 - skin remains open with VAC  -? Injury to R inguinal site with agitation this am? - RN to notify surgery   Septic shock with peritonitis and E coli tracheobronchitis. - Day 6 of Abx - pressors to keep MAP > 65  - d/c vasopressin then wean levo to off   Acute respiratory failure with hypoxia. - full vent support until more stable  Hyperglycemia. -  SSI  Acute metabolic encephalopathy. - RASS goal -1 - changed to precedex >> hypertriglyceridemia with diprivan - fentanyl gtt  - PRN versed  - Daily WUA  DVT prophylaxis - SQ heparin, SCDs SUP - Pepcid Nutrition - NPO  Goals of care - full code  Resolved problems >> AKI, lactic acidosis   Dirk DressKaty Whiteheart, NP 07/18/2016  9:10 AM Pager: [phone] or [phone]   Remains of pressors, vent support.  Had serous oozing from inguinal site.  Sedated.  No wheeze.  HR regular.  Wound vac in place.  Assessment/plan:  Acute respiratory failure. - full vent support  Septic shock. - continue pressors, Abx  S/p laparotomy. - per surgery  CC time by me independent of APP time 31 minutes  Coralyn HellingVineet Abeeha Twist, MD [facility] 07/18/2016, 12:23 PM Pager:  [phone] After 3pm call: [phone]

## 2016-07-18 NOTE — Progress Notes (Signed)
RN called RT to room due to patient self extubated. CCM on camera. Pt placed on O2 nasal cannula 2 L. Pt breathing well. Seems a little confused.  Will cont to monitor

## 2016-07-18 NOTE — Progress Notes (Signed)
   RN calling emD  On 400mcg fnet and 1.2 fent + fent/versed prn  Plan Haldol 5mg  IV stat (QTc in 400s per rn)  Dr. Kalman ShanMurali Fawna Cranmer, M.D., [facility].P Pulmonary and Critical Care Medicine Staff Physician [facility]monary and Critical Care Pager: [phone], If no answer or between  15:00h - 7:00h: call [phone]  07/18/2016 2:02 AM

## 2016-07-19 ENCOUNTER — Inpatient Hospital Stay (HOSPITAL_COMMUNITY): Payer: Medicaid Other

## 2016-07-19 DIAGNOSIS — E8770 Fluid overload, unspecified: Secondary | ICD-10-CM

## 2016-07-19 LAB — GLUCOSE, CAPILLARY
GLUCOSE-CAPILLARY: 134 mg/dL — AB (ref 65–99)
GLUCOSE-CAPILLARY: 134 mg/dL — AB (ref 65–99)
Glucose-Capillary: 113 mg/dL — ABNORMAL HIGH (ref 65–99)
Glucose-Capillary: 120 mg/dL — ABNORMAL HIGH (ref 65–99)
Glucose-Capillary: 127 mg/dL — ABNORMAL HIGH (ref 65–99)
Glucose-Capillary: 132 mg/dL — ABNORMAL HIGH (ref 65–99)

## 2016-07-19 LAB — BASIC METABOLIC PANEL
ANION GAP: 11 (ref 5–15)
BUN: 14 mg/dL (ref 6–20)
CO2: 24 mmol/L (ref 22–32)
Calcium: 7.9 mg/dL — ABNORMAL LOW (ref 8.9–10.3)
Chloride: 106 mmol/L (ref 101–111)
Creatinine, Ser: 1.03 mg/dL (ref 0.61–1.24)
GFR calc Af Amer: >60 mL/min (ref >60)
GLUCOSE: 116 mg/dL — AB (ref 65–99)
POTASSIUM: 4.1 mmol/L (ref 3.5–5.1)
Sodium: 141 mmol/L (ref 135–145)

## 2016-07-19 LAB — CBC
HEMATOCRIT: 40.2 % (ref 39.0–52.0)
Hemoglobin: 12.8 g/dL — ABNORMAL LOW (ref 13.0–17.0)
MCH: 28.3 pg (ref 26.0–34.0)
MCHC: 31.8 g/dL (ref 30.0–36.0)
MCV: 88.9 fL (ref 78.0–100.0)
PLATELETS: 178 10*3/uL (ref 150–400)
RBC: 4.52 MIL/uL (ref 4.22–5.81)
RDW: 15.2 % (ref 11.5–15.5)
WBC: 17.4 10*3/uL — AB (ref 4.0–10.5)

## 2016-07-19 MED ORDER — SODIUM CHLORIDE 0.9 % IV SOLN
400.0000 mg | Freq: Once | INTRAVENOUS | Status: AC
  Administered 2016-07-19: 400 mg via INTRAVENOUS
  Filled 2016-07-19: qty 4

## 2016-07-19 MED ORDER — FUROSEMIDE 10 MG/ML IJ SOLN
40.0000 mg | Freq: Once | INTRAMUSCULAR | Status: AC
  Administered 2016-07-19: 40 mg via INTRAVENOUS
  Filled 2016-07-19: qty 4

## 2016-07-19 NOTE — Progress Notes (Signed)
eLink Physician-Brief Progress Note Patient Name: Travis Gould DOB: 03/09/1971 MRN: [id number]   Date of Service  07/19/2016  HPI/Events of Note  Pt febrile  eICU Interventions  See order for IV ibuprofen     Intervention Category Intermediate Interventions: Other:  Shan LevansPatrick Mamta Rimmer 07/19/2016, 6:06 PM

## 2016-07-19 NOTE — Progress Notes (Signed)
2 Days Post-Op  Subjective: Patient self-exubated yesterday - has remained off vent Off all pressors since last night. Breathing comfortably No significant abdominal pain  Objective: Vital signs in last 24 hours: Temp:  [98.6 F (37 C)-100.3 F (37.9 C)] 99.6 F (37.6 C) (12/17 1000) Pulse Rate:  [68-192] 110 (12/17 1000) Resp:  [13-35] 25 (12/17 1000) BP: (86-143)/(53-86) 143/86 (12/17 1000) SpO2:  [92 %-100 %] 95 % (12/17 1000) Arterial Line BP: (73-101)/(54-69) 73/54 (12/16 1715) FiO2 (%):  [35 %] 35 % (12/16 1300) Last BM Date:  (PTA)  Intake/Output from previous day: 12/16 0701 - 12/17 0700 In: 3010.4 [I.V.:2610.4; NG/GT:120; IV Piggyback:250] Out: 3125 [Urine:1375; Emesis/NG output:1700; Drains:50] Intake/Output this shift: Total I/O In: 375 [I.V.:305; NG/GT:20; IV Piggyback:50] Out: 575 [Urine:125; Emesis/NG output:450]  General appearance: alert, cooperative and no distress GI: + BS; soft, minimal distention; VAC with good seal Right inguinal incision - soft, decompressed; thin clear drainage from between staples  Lab Results:   Recent Labs  07/18/16 0421 07/19/16 0400  WBC 12.9* 17.4*  HGB 12.2* 12.8*  HCT 37.9* 40.2  PLT 123* 178   BMET  Recent Labs  07/18/16 0421 07/19/16 0400  NA 135 141  K 4.5 4.1  CL 105 106  CO2 22 24  GLUCOSE 107* 116*  BUN 20 14  CREATININE 0.81 1.03  CALCIUM 7.4* 7.9*   PT/INR No results for input(s): LABPROT, INR in the last 72 hours. ABG  Recent Labs  07/17/16 0534 07/18/16 1747  PHART 7.365 7.329*  HCO3 22.3 23.1    Studies/Results: Dg Chest Portable 1 View  Result Date: 07/19/2016 CLINICAL DATA:  Respiratory failure. EXAM: PORTABLE CHEST 1 VIEW COMPARISON:  07/18/2016 FINDINGS: Endotracheal tube has been removed. Left central line and NG tube remain in place, unchanged. Low lung volumes with bibasilar opacities, unchanged. Possible small effusions. Heart is borderline in size. IMPRESSION: Interval  extubation. No real change in the appearance of lungs since prior study with low lung volumes, bibasilar atelectasis and possible small effusions. Electronically Signed   By: Charlett NoseKevin  Dover M.D.   On: 07/19/2016 07:41   Dg Chest Port 1 View  Result Date: 07/18/2016 CLINICAL DATA:  Respiratory failure, shortness of Breath EXAM: PORTABLE CHEST 1 VIEW COMPARISON:  07/16/2016 FINDINGS: Support devices are stable. Low lung volumes with bibasilar opacities, similar prior study. No visible effusions. Heart is borderline in size, likely accentuated by the low volumes. IMPRESSION: Low lung volumes with bibasilar atelectasis or infiltrates. No real change. Electronically Signed   By: Charlett NoseKevin  Dover M.D.   On: 07/18/2016 07:50    Anti-infectives: Anti-infectives    Start     Dose/Rate Route Frequency Ordered Stop   07/13/16 0600  piperacillin-tazobactam (ZOSYN) IVPB 3.375 g     3.375 g 12.5 mL/hr over 240 Minutes Intravenous Every 8 hours 07/13/16 0225     07/12/16 2300  piperacillin-tazobactam (ZOSYN) IVPB 3.375 g     3.375 g 100 mL/hr over 30 Minutes Intravenous  Once 07/12/16 2246 07/12/16 2309   07/12/16 2300  vancomycin (VANCOCIN) 2,000 mg in sodium chloride 0.9 % 500 mL IVPB     2,000 mg 250 mL/hr over 120 Minutes Intravenous  Once 07/12/16 2246 07/13/16 0101      Assessment/Plan: Recurrent right inguinal hernia, strangulation of terminal ileum, bowel obstruction s/p RIH repair of incarcerated hernia, insertion of Mesh 07/07/16, Dr. Violeta GelinasBurke Thompson S/p exploration of right groin, reduction of right inguinal hernia, resection of small bowel, placement of 200cm^2 negative  pressure dressing; 07/13/16, Dr. Feliciana RossettiLuke Kinsinger Exploratory laparotomy/ ileocecectomy, appendectomy - 07/15/16 Dr Lindie SpruceWyatt s/p Procedure(s): EXPLORATORY LAPAROTOMY, PRIMARY REPAIR OF RIGHT INGUINAL HERNIA , CLOSURE OF ABDOMEN (N/A) 07/17/16 Dr Joan FloresWyatt  Wean pressors as tolerated Cont bowel rest, ng tube decompression - may have  ice chips until some sign of bowel function His Right inguinal hernia site - only had sac closure followed by skin closure. Couldn't re-approximate muscle so not surprised he is having serous drainage from incision. Change dry gauze as needed; could try Eakin pouch or consult WOCN re: VAC on top of staples  LOS: 6 days    Vivianne Carles K. 07/19/2016

## 2016-07-19 NOTE — Progress Notes (Signed)
PCCM Progress Note    Admission date:  07/12/2016 Consult date:  07/14/2016 Referring provider: Dr. Lindie SpruceWyatt, CCS  CC:  Abdominal pain  HPI:   45 yo male had incarcerated Rt inguinal hernia repair with MESH 12/05 and d/c home.  Developed n/v/d 12/10 and readmitted.  Found to have strangulation of terminal ileum and bowel obstruction requiring laparotomy.  Complicated by septic shock, VDRF.  Subjective:  Denies chest pain.  Wants to know when he can eat.  Vital signs: BP (!) 143/86   Pulse (!) 110   Temp 99.6 F (37.6 C) (Oral)   Resp (!) 25   Ht 5\' 11"  (1.803 m)   Wt 258 lb 2.5 oz (117.1 kg)   SpO2 95%   BMI 36.01 kg/m   Hemodynamics: CVP:  [0 mmHg-11 mmHg] 0 mmHg  Intake/output: I/O last 3 completed shifts: In: 5264.3 [I.V.:4554.3; Other:30; NG/GT:180; IV Piggyback:500] Out: 4325 [Urine:1825; Emesis/NG output:2450; Drains:50]  General: alert Neuro: follows commands HEENT: no stridor Cardiovascular: regular, no murmur Lungs: resps even non labored on vent, diminished bases, no wheeze Abdomen:  Midline open abd with VAC, R groin staples intact, site oozing Musculoskeletal: no edema Skin: no rashes   CMP Latest Ref Rng & Units 07/19/2016 07/18/2016 07/17/2016  Glucose 65 - 99 mg/dL 829(F116(H) 621(H107(H) 086(V112(H)  BUN 6 - 20 mg/dL 14 20 18   Creatinine 0.61 - 1.24 mg/dL 7.841.03 6.960.81 2.950.86  Sodium 135 - 145 mmol/L 141 135 132(L)  Potassium 3.5 - 5.1 mmol/L 4.1 4.5 4.1  Chloride 101 - 111 mmol/L 106 105 104  CO2 22 - 32 mmol/L 24 22 21(L)  Calcium 8.9 - 10.3 mg/dL 7.9(L) 7.4(L) 7.3(L)  Total Protein 6.5 - 8.1 g/dL - - -  Total Bilirubin 0.3 - 1.2 mg/dL - - -  Alkaline Phos 38 - 126 U/L - - -  AST 15 - 41 U/L - - -  ALT 17 - 63 U/L - - -    CBC Latest Ref Rng & Units 07/19/2016 07/18/2016 07/17/2016  WBC 4.0 - 10.5 K/uL 17.4(H) 12.9(H) 14.3(H)  Hemoglobin 13.0 - 17.0 g/dL 12.8(L) 12.2(L) 12.4(L)  Hematocrit 39.0 - 52.0 % 40.2 37.9(L) 37.0(L)  Platelets 150 - 400 K/uL 178  123(L) 145(L)    ABG    Component Value Date/Time   PHART 7.329 (L) 07/18/2016 1747   PCO2ART 44.2 07/18/2016 1747   PO2ART 100.0 07/18/2016 1747   HCO3 23.1 07/18/2016 1747   TCO2 24 07/18/2016 1747   ACIDBASEDEF 3.0 (H) 07/18/2016 1747   O2SAT 97.0 07/18/2016 1747    CBG (last 3)   Recent Labs  07/19/16 0017 07/19/16 0400 07/19/16 0746  GLUCAP 120* 113* 127*    Imaging: Dg Chest Portable 1 View  Result Date: 07/19/2016 CLINICAL DATA:  Respiratory failure. EXAM: PORTABLE CHEST 1 VIEW COMPARISON:  07/18/2016 FINDINGS: Endotracheal tube has been removed. Left central line and NG tube remain in place, unchanged. Low lung volumes with bibasilar opacities, unchanged. Possible small effusions. Heart is borderline in size. IMPRESSION: Interval extubation. No real change in the appearance of lungs since prior study with low lung volumes, bibasilar atelectasis and possible small effusions. Electronically Signed   By: Charlett NoseKevin  Dover M.D.   On: 07/19/2016 07:41   Dg Chest Port 1 View  Result Date: 07/18/2016 CLINICAL DATA:  Respiratory failure, shortness of Breath EXAM: PORTABLE CHEST 1 VIEW COMPARISON:  07/16/2016 FINDINGS: Support devices are stable. Low lung volumes with bibasilar opacities, similar prior study. No visible effusions. Heart  is borderline in size, likely accentuated by the low volumes. IMPRESSION: Low lung volumes with bibasilar atelectasis or infiltrates. No real change. Electronically Signed   By: Charlett NoseKevin  Dover M.D.   On: 07/18/2016 07:50     Studies:  CT abd/pelvis 12/10 >> Recurrent Rt inguinal hernia with SBO  Cultures: Blood 12/10 >>NEG Sputum 12/12 >> E coli  Antibiotics: 12/11 Zosyn >>  Events: 12/10 open lap 12/12 Fever 105 12/13 return to OR >> wash out 12/15 OR, abd closure, repair R inguinal hernia  12/16 self extubated 12/17 off pressors  Lines/tubes: ETT 12/10 >> 12/16  Lt [place] CVL 12/10 >>  ASSESSMENT / PLAN:  Strangulated SB after  repair of incarcerated right inguinal hernia. - per surgery   Septic shock with peritonitis and E coli tracheobronchitis. - Day 7/10of Abx  Acute respiratory failure with hypoxia. - monitor respiratory status after extubation  Hypervolemia. - lasix 40 mg IV x one 12/17  Hyperglycemia. - SSI  DVT prophylaxis - SQ heparin, SCDs SUP - Pepcid Nutrition - NPO  Goals of care - full code  Resolved problems >> AKI, lactic acidosis, acute encephalopathy   Coralyn HellingVineet Dorrie Cocuzza, MD [facility]Critical [facility] 07/19/2016, 10:35 AM Pager:  [phone] After 3pm call: [phone]

## 2016-07-20 ENCOUNTER — Encounter (HOSPITAL_COMMUNITY): Payer: Self-pay | Admitting: General Surgery

## 2016-07-20 DIAGNOSIS — R739 Hyperglycemia, unspecified: Secondary | ICD-10-CM

## 2016-07-20 DIAGNOSIS — A419 Sepsis, unspecified organism: Secondary | ICD-10-CM

## 2016-07-20 DIAGNOSIS — R112 Nausea with vomiting, unspecified: Secondary | ICD-10-CM

## 2016-07-20 DIAGNOSIS — R6521 Severe sepsis with septic shock: Secondary | ICD-10-CM

## 2016-07-20 DIAGNOSIS — J9690 Respiratory failure, unspecified, unspecified whether with hypoxia or hypercapnia: Secondary | ICD-10-CM

## 2016-07-20 DIAGNOSIS — J9601 Acute respiratory failure with hypoxia: Secondary | ICD-10-CM

## 2016-07-20 DIAGNOSIS — J40 Bronchitis, not specified as acute or chronic: Secondary | ICD-10-CM

## 2016-07-20 DIAGNOSIS — K4090 Unilateral inguinal hernia, without obstruction or gangrene, not specified as recurrent: Secondary | ICD-10-CM

## 2016-07-20 LAB — CBC
HCT: 41.2 % (ref 39.0–52.0)
HEMOGLOBIN: 13.3 g/dL (ref 13.0–17.0)
MCH: 29 pg (ref 26.0–34.0)
MCHC: 32.3 g/dL (ref 30.0–36.0)
MCV: 90 fL (ref 78.0–100.0)
PLATELETS: 199 10*3/uL (ref 150–400)
RBC: 4.58 MIL/uL (ref 4.22–5.81)
RDW: 14.9 % (ref 11.5–15.5)
WBC: 17.9 10*3/uL — AB (ref 4.0–10.5)

## 2016-07-20 LAB — GLUCOSE, CAPILLARY
GLUCOSE-CAPILLARY: 144 mg/dL — AB (ref 65–99)
GLUCOSE-CAPILLARY: 124 mg/dL — AB (ref 65–99)
GLUCOSE-CAPILLARY: 134 mg/dL — AB (ref 65–99)
Glucose-Capillary: 125 mg/dL — ABNORMAL HIGH (ref 65–99)
Glucose-Capillary: 127 mg/dL — ABNORMAL HIGH (ref 65–99)
Glucose-Capillary: 131 mg/dL — ABNORMAL HIGH (ref 65–99)
Glucose-Capillary: 135 mg/dL — ABNORMAL HIGH (ref 65–99)

## 2016-07-20 LAB — BASIC METABOLIC PANEL
ANION GAP: 12 (ref 5–15)
BUN: 22 mg/dL — ABNORMAL HIGH (ref 6–20)
CALCIUM: 7.7 mg/dL — AB (ref 8.9–10.3)
CHLORIDE: 106 mmol/L (ref 101–111)
CO2: 26 mmol/L (ref 22–32)
Creatinine, Ser: 1.09 mg/dL (ref 0.61–1.24)
GFR calc non Af Amer: >60 mL/min (ref >60)
Glucose, Bld: 124 mg/dL — ABNORMAL HIGH (ref 65–99)
Potassium: 3.6 mmol/L (ref 3.5–5.1)
SODIUM: 144 mmol/L (ref 135–145)

## 2016-07-20 MED ORDER — SODIUM CHLORIDE 0.9 % IV SOLN
30.0000 meq | Freq: Once | INTRAVENOUS | Status: AC
  Administered 2016-07-20: 30 meq via INTRAVENOUS
  Filled 2016-07-20: qty 15

## 2016-07-20 MED ORDER — ACETAMINOPHEN 650 MG RE SUPP
650.0000 mg | Freq: Four times a day (QID) | RECTAL | Status: DC | PRN
  Filled 2016-07-20: qty 1

## 2016-07-20 MED ORDER — TRACE MINERALS CR-CU-MN-SE-ZN 10-1000-500-60 MCG/ML IV SOLN
INTRAVENOUS | Status: AC
  Administered 2016-07-20: 18:00:00 via INTRAVENOUS
  Filled 2016-07-20: qty 960

## 2016-07-20 MED ORDER — MORPHINE SULFATE (PF) 2 MG/ML IV SOLN
2.0000 mg | INTRAVENOUS | Status: DC | PRN
Start: 2016-07-20 — End: 2016-07-28
  Administered 2016-07-20 – 2016-07-22 (×4): 2 mg via INTRAVENOUS
  Filled 2016-07-20 (×5): qty 1

## 2016-07-20 MED ORDER — SODIUM CHLORIDE 0.9% FLUSH
10.0000 mL | INTRAVENOUS | Status: DC | PRN
  Administered 2016-07-26: 20 mL
  Administered 2016-07-26: 10 mL
  Administered 2016-07-28: 20 mL
  Filled 2016-07-20 (×3): qty 40

## 2016-07-20 MED ORDER — SODIUM CHLORIDE 0.9% FLUSH
10.0000 mL | Freq: Two times a day (BID) | INTRAVENOUS | Status: DC
  Administered 2016-07-20 – 2016-07-26 (×4): 10 mL
  Administered 2016-07-27: 20 mL

## 2016-07-20 MED ORDER — SODIUM CHLORIDE 0.9 % IV SOLN
200.0000 mg | Freq: Once | INTRAVENOUS | Status: AC
  Administered 2016-07-20: 200 mg via INTRAVENOUS
  Filled 2016-07-20: qty 200

## 2016-07-20 MED ORDER — FUROSEMIDE 10 MG/ML IJ SOLN
40.0000 mg | Freq: Once | INTRAMUSCULAR | Status: AC
  Administered 2016-07-20: 40 mg via INTRAVENOUS
  Filled 2016-07-20: qty 4

## 2016-07-20 MED ORDER — MORPHINE SULFATE (PF) 2 MG/ML IV SOLN
INTRAVENOUS | Status: AC
  Filled 2016-07-20: qty 1

## 2016-07-20 MED ORDER — ANIDULAFUNGIN 100 MG IV SOLR
100.0000 mg | INTRAVENOUS | Status: AC
  Administered 2016-07-21 – 2016-07-22 (×2): 100 mg via INTRAVENOUS
  Filled 2016-07-20 (×2): qty 100

## 2016-07-20 NOTE — Progress Notes (Signed)
Wound check, vac changed today    Pt required pain medication prior to dressing changes. Pt tolerated it well. Wound is granulating nicely with very minimal slough. No necrotic tissue appreciated. Wound base was clean and dry. Will reevaluate the wound Wednesday.  Mattie MarlinJessica Debie Ashline, [facility] Pager [phone]

## 2016-07-20 NOTE — Progress Notes (Signed)
Peripherally Inserted Central Catheter/Midline Placement  The IV Nurse has discussed with the patient and/or persons authorized to consent for the patient, the purpose of this procedure and the potential benefits and risks involved with this procedure.  The benefits include less needle sticks, lab draws from the catheter, and the patient may be discharged home with the catheter. Risks include, but not limited to, infection, bleeding, blood clot (thrombus formation), and puncture of an artery; nerve damage and irregular heartbeat and possibility to perform a PICC exchange if needed/ordered by physician.  Alternatives to this procedure were also discussed.  Bard Power PICC patient education guide, fact sheet on infection prevention and patient information card has been provided to patient /or left at bedside.    PICC/Midline Placement Documentation      Information explained to mother Rudene ReCarolyn Chiu .  Franne Gripewman, Suman Trivedi Renee 07/20/2016, 5:28 PM

## 2016-07-20 NOTE — Plan of Care (Signed)
Problem: Bowel/Gastric: Goal: Gastrointestinal status for postoperative course will improve Outcome: Progressing BS present. NGT in place to LIWS. Managing ice chips with no nausea. No nausea/vomiting in past 48 hours.   Problem: Cardiac: Goal: Ability to maintain an adequate cardiac output will improve Outcome: Completed/Met Date Met: 07/20/16 Patient BP, Hgb and cardiac rhythm stable. No interventions needed. Goal: Will show no evidence of cardiac arrhythmias Outcome: Completed/Met Date Met: 07/20/16 Cardiac rhythm stable, no evidence of arrhythmias noted.  Problem: Respiratory: Goal: Ability to maintain adequate ventilation will improve Outcome: Progressing Patient no longer requiring mechanical ventilation. On low dose oxygen tolerating well. Progressing well with IS.

## 2016-07-20 NOTE — Progress Notes (Signed)
S: Febrile overnight. Denies pain.   Vitals, labs, intake/output, and orders reviewed at this time. Tmax, mildly tachy, normotensive, sats fine on . Adequate UOP. NG 2800 out. Bm x 2. WBC 17.9 Stable from yesterday but increased from Saturday.  Gen: Alert, interactive, no distress H&N: EOMI, atraumatic, neck supple Chest: unlabored respirations, RRR Abd: soft, nontender, nondistended, incisional vac in place with SS output. R groin incision weeping serous fluid.  Ext: warm, mild edema Neuro: grossly normal  Lines/tubes/drains: Foley (in 7 days), CVL (in 6 days), Ng, wound vac  A/P:   Recurrent right inguinal hernia, strangulation of terminal ileum, bowel obstruction s/p RIH repair of incarcerated hernia, insertion of Mesh 07/07/16, Dr. Violeta GelinasBurke Thompson S/p exploration of right groin, reduction of right inguinal hernia, resection of small bowel, placement of 200cm^2 negative pressure dressing; 07/13/16, Dr. Feliciana RossettiLuke Kinsinger Exploratory laparotomy/ ileocecectomy, appendectomy- 07/15/16 Dr Lindie SpruceWyatt s/p Procedure(s): EXPLORATORY LAPAROTOMY, PRIMARY REPAIR OF RIGHT INGUINAL HERNIA , CLOSURE OF ABDOMEN (N/A) 07/17/16 Dr Lindie SpruceWyatt  Cont bowel rest, ng tube decompression - having Bm but high NG output. may have ice chips but need to be recorded  His Right inguinal hernia site - only had sac closure followed by skin closure. Couldn't re-approximate muscle so not surprised he is having serous drainage from incision. Change dry gauze as needed; will consult wound care for possible prevena   Also needs midline vac dressing changed  Pan culture for fevers. Pulmonary toilet. Needs foley out and CVL out.   Needs TNA/ PICC   Phylliss Blakeshelsea Petra Sargeant, MD [facility] [place] Pager [phone]

## 2016-07-20 NOTE — Progress Notes (Signed)
Urinary catheter removed per order. Patient denies bladder fullness, abdominal pain or urge to urinate. Bladder scan performed at 1830 showing 565 cc. In and out catheterization performed and 500 cc of yellow urine retrieved. Will make oncoming shift aware.

## 2016-07-20 NOTE — Progress Notes (Addendum)
Nutrition Consult/Follow Up  DOCUMENTATION CODES:   Obesity unspecified  INTERVENTION:    TPN per pharmacy  NUTRITION DIAGNOSIS:   Inadequate oral intake related to inability to eat as evidenced by NPO status, ongoing  NEW GOAL:   Patient will meet greater than or equal to 90% of their needs, progressing  MONITOR:   Diet advancement, Labs, Weight trends, Skin, I & O's, TPN  ASSESSMENT:   45 y.o. Male with symptoms of nausea vomiting abdominal pain with diarrhea. He presented with acute kidney insufficiency as well as lactic acidosis on CT scanning showed multiple dilated loops of bowel and recurrent right inguinal hernia.  Pt s/p procedures 12/11: EXPLORATION OF RIGHT GROIN STRANGULATION OF TERMINAL ILEUM BOWEL OBSTRUCTION  Pt s/p procedures 12/15: EXPLORATORY LAPAROTOMY,  PRIMARY REPAIR OF RIGHT INGUINAL HERNIA  CLOSURE OF ABDOMEN  Pt self-extubated 12/16. Starting TPN for nutrition support. Labs and medications reviewed. NGT to LIS >> high output. CBG's [phone].  Patient is receiving TPN with Clinimix E 4.25/10 @ 40 ml/hr.  Lipids (20% IVFE) on hold at this time.  Provides 490 kcal and 40 grams protein per day.  Meets 20% minimum estimated energy needs and 26% minimum estimated protein needs.  Diet Order:  Diet NPO time specified Except for: Ice Chips .TPN (CLINIMIX-E) Adult  Skin:  Wound (see comment) (abdominal wound VAC)  Last BM:  12/17  Height:   Ht Readings from Last 1 Encounters:  07/13/16 5\' 11"  (1.803 m)    Weight:   Wt Readings from Last 1 Encounters:  07/16/16 258 lb 2.5 oz (117.1 kg)    Ideal Body Weight:  78.1 kg  BMI:  Body mass index is 36.01 kg/m.  Re-estimated Nutritional Needs:   Kcal:  2400-2600  Protein:  >/= 150 gm  Fluid:  per MD  EDUCATION NEEDS:   No education needs identified at this time  Maureen ChattersKatie Josephine Rudnick, RD, LDN Pager #: [phone] After-Hours Pager #: [phone]

## 2016-07-20 NOTE — Anesthesia Postprocedure Evaluation (Signed)
Anesthesia Post Note  Patient: Travis PerlShane Q Janice  Procedure(s) Performed: Procedure(s) (LRB): EXPLORATORY LAPAROTOMY; ILEO-COLONIC ANASTOMOSIS; REAPPLICATION OF WOUND VAC (N/A) APPENDECTOMY (N/A) SMALL BOWEL RESECTION (N/A)  Patient location during evaluation: SICU Anesthesia Type: General Level of consciousness: sedated Pain management: pain level controlled Vital Signs Assessment: post-procedure vital signs reviewed and stable Respiratory status: patient remains intubated per anesthesia plan Cardiovascular status: stable Postop Assessment: no signs of nausea or vomiting Anesthetic complications: no    Last Vitals:  Vitals:   07/20/16 1100 07/20/16 1200  BP: 134/83 130/88  Pulse: (!) 108 (!) 106  Resp: (!) 23 (!) 23  Temp:      Last Pain:  Vitals:   07/20/16 1200  TempSrc:   PainSc: 0-No pain                 Aayat Hajjar

## 2016-07-20 NOTE — Progress Notes (Signed)
PCCM Progress Note    Admission date:  07/12/2016 Consult date:  07/14/2016 Referring provider: Dr. Lindie SpruceWyatt, CCS  CC:  Abdominal pain  HPI:   45 yo male had incarcerated Rt inguinal hernia repair with MESH 12/05 and d/c home.  Developed n/v/d 12/10 and readmitted.  Found to have strangulation of terminal ileum and bowel obstruction requiring laparotomy.  Complicated by septic shock, VDRF.  Subjective: Pt denies acute complaints.  WOC RN in room changing VAC dressing.  R inguinal surgical wound c/d/i.  Weaned off vasopressors 12/16 pm.  Tmax 101.6.  Primary SVC has pan cultured, planned placement of PICC / removal of TLC.   Vital signs: BP 123/86   Pulse (!) 107   Temp (!) 100.9 F (38.3 C) (Rectal)   Resp (!) 26   Ht 5\' 11"  (1.803 m)   Wt 258 lb 2.5 oz (117.1 kg)   SpO2 96%   BMI 36.01 kg/m   Hemodynamics:    Intake/output: I/O last 3 completed shifts: In: 3299.5 [P.O.:480; I.V.:2115.5; NG/GT:200; IV Piggyback:504] Out: 6140 [Urine:1840; Emesis/NG output:4250; Drains:50]  General: obese male in NAD, alert Neuro: follows commands, speech clear  HEENT: no stridor Cardiovascular: regular, no murmur Lungs: resps even non labored, diminished bases, no wheeze Abdomen:  Midline open abd with VAC, R groin staples intact, site oozing with VAC placement pending Musculoskeletal: no edema Skin: no rashes or lesions   CMP Latest Ref Rng & Units 07/20/2016 07/19/2016 07/18/2016  Glucose 65 - 99 mg/dL 161(W124(H) 960(A116(H) 540(J107(H)  BUN 6 - 20 mg/dL 81(X22(H) 14 20  Creatinine 0.61 - 1.24 mg/dL 9.141.09 7.821.03 9.560.81  Sodium 135 - 145 mmol/L 144 141 135  Potassium 3.5 - 5.1 mmol/L 3.6 4.1 4.5  Chloride 101 - 111 mmol/L 106 106 105  CO2 22 - 32 mmol/L 26 24 22   Calcium 8.9 - 10.3 mg/dL 7.7(L) 7.9(L) 7.4(L)  Total Protein 6.5 - 8.1 g/dL - - -  Total Bilirubin 0.3 - 1.2 mg/dL - - -  Alkaline Phos 38 - 126 U/L - - -  AST 15 - 41 U/L - - -  ALT 17 - 63 U/L - - -    CBC Latest Ref Rng & Units  07/20/2016 07/19/2016 07/18/2016  WBC 4.0 - 10.5 K/uL 17.9(H) 17.4(H) 12.9(H)  Hemoglobin 13.0 - 17.0 g/dL 21.313.3 12.8(L) 12.2(L)  Hematocrit 39.0 - 52.0 % 41.2 40.2 37.9(L)  Platelets 150 - 400 K/uL 199 178 123(L)    ABG    Component Value Date/Time   PHART 7.329 (L) 07/18/2016 1747   PCO2ART 44.2 07/18/2016 1747   PO2ART 100.0 07/18/2016 1747   HCO3 23.1 07/18/2016 1747   TCO2 24 07/18/2016 1747   ACIDBASEDEF 3.0 (H) 07/18/2016 1747   O2SAT 97.0 07/18/2016 1747    CBG (last 3)   Recent Labs  07/20/16 0007 07/20/16 0358 07/20/16 0717  GLUCAP 135* 127* 144*    Imaging: Dg Chest Portable 1 View  Result Date: 07/19/2016 CLINICAL DATA:  Respiratory failure. EXAM: PORTABLE CHEST 1 VIEW COMPARISON:  07/18/2016 FINDINGS: Endotracheal tube has been removed. Left central line and NG tube remain in place, unchanged. Low lung volumes with bibasilar opacities, unchanged. Possible small effusions. Heart is borderline in size. IMPRESSION: Interval extubation. No real change in the appearance of lungs since prior study with low lung volumes, bibasilar atelectasis and possible small effusions. Electronically Signed   By: Charlett NoseKevin  Dover M.D.   On: 07/19/2016 07:41    Studies:  CT abd/pelvis 12/10 >>  Recurrent Rt inguinal hernia with SBO  Cultures: Blood 12/10 >> NEG Sputum 12/12 >> E coli Blood 12/18 >>  Urine 12/18 >>   Antibiotics: Zosyn 12/11 >> Eraxis 12/18 >>   Events: 12/10 open lap 12/12 Fever 105 12/13 return to OR >> wash out 12/15 OR, abd closure, repair R inguinal hernia  12/16 self extubated 12/17 off pressors 12/18 VAC applied to R groin.  Tmax 101.6, recultured.  Lines/tubes: ETT 12/10 >> 12/16  Lt [place] CVL 12/10 >>   ASSESSMENT / PLAN:  Strangulated SB after repair of incarcerated right inguinal hernia. - per surgery   Septic shock with peritonitis and E coli tracheobronchitis. - Day 8/10of Abx - Add Eraxis 12/18  - monitor closely with addition of  TPN - central line to be d/c'd and PICC line placed  Acute respiratory failure with hypoxia. - monitor respiratory status after extubation  Hypervolemia. -repeat lasix 40 mg IV x1 12/18 with KCL  Hyperglycemia. - SSI  DVT prophylaxis - SQ heparin, SCDs SUP - Pepcid Nutrition - NPO  Goals of care - full code  Resolved problems >> AKI, lactic acidosis, acute encephalopathy   Canary BrimBrandi Ollis, NP-C [facility] Pgr: [phone] or if no answer [phone] 07/20/2016, 11:01 AM  Attending Note:  45 year old male s/p abdominal perf with multiple visits to the OR who is now extubated and hemodynamically stable.  One exam, lungs are clear bilaterally with decreased BS at the bases.  I reviewed CXR myself, no acute disease noted.  Discussed with bedside RN and PCCM-NP.  Septic shock  - D/C pressors.  SB perforation:  - Add eraxis.  - Per surgery.  Tracheobronchitis  - Abx as ordered.  - F/u on cultures.  Respiratory failure  - Titrate O2 for sat of 88-92%.  - Monitor for issues with airway protection  Hyperglycemia:  - ISS  - CBGs  - May consider adding insulin to the TPN  PCCM will sign off, please call back if needed.  Patient seen and examined, agree with above note.  I dictated the care and orders written for this patient under my direction.  Alyson ReedyWesam G Marycatherine Maniscalco, MD [phone]

## 2016-07-20 NOTE — Progress Notes (Signed)
PHARMACY - ADULT TOTAL PARENTERAL NUTRITION CONSULT NOTE   Pharmacy Consult for TPN Indication: Prolonged Ileus  Patient Measurements: Height: 5' 11" (180.3 cm) Weight: 258 lb 2.5 oz (117.1 kg) IBW/kg (Calculated) : 75.3 TPN AdjBW (KG): 85.7 Body mass index is 36.01 kg/m. Usual Weight:   Assessment: 45 YOM recently admitted 12/5-12/6 with an incarcerated R-inguinal hernia s/p insertion of mesh on 12/5. The patient represented on 12/10 with abdominal pain and N/V and was found to have a SBO - now s/p exploration with small bowel resection and placement of negative pressure dressing 12/11. The patient was taken back to the OR 12/13 for re-exploration and was found to have an ischemic distal small bowel - s/p appendectomy and small bowel anastomosis. The patient was taken back to the OR 12/15 for R-inguinal hernia repair and closure of the abdomen. Pt with NGT in place for decompression. Pharmacy consulted to start TPN for nutritional support 12/18. Noted CVC to be pulled today and PICC line placed - confirmed with IR that they will be able to do this today.  GI: Prolonged NPO>7d, s/p hernia repair 12/5, partial colectomy 12/11, s/p appendectomy, SB anastomosis, closure 12/15. NGT in place to suction - output/24h: 3.5L, LBM 12/17. L-[place] CVC in place since 12/11.  Endo: No hx. CBGs 120-144. On sSSI q4h * Insulin requirements in the past 24 hours: 5 units Lytes: K 3.6, no recent Mg/Phos. Renal: SCr 1.09, CrCl~80-100 ml/min. UOP/24h: 0.5 ml/kg/hr Pulm: 96%/2L-Adrian Cards: BP wnl-elevated, HR wnl-tachy. No CV meds Hepatobil: AST 57, ALT wnl. T bili/alk phos wnl (12/13) Neuro: No c/o pain ID: Zosyn D#9 for empiric abdominal coverage & E.coli RCx from 12/12 however continues with persistent fevers. Could likely benefit from fungal coverage given gut issues. Tmax/24h: 101.9, WBC 17.9 << 12.9  Best Practices: Hep SQ (VTE px), pepcid (SUP) TPN Access: L-[place] CVC (to be removed), PICC to be placed 12/18 -  confirmed with IR  TPN start date: 12/18  Nutritional Goals (per RD recommendation on 12/11): KCal: 1276-1638 kcal Protein: 155-165g  Current Nutrition:  NPO  Plan:  Start Clinimix 4.25/10 at 40 ml/hr Hold 20% lipid emulsion for first 7 days for ICU patients per ASPEN guidelines (Start date 12/18) Reduce IVMF (D5, NS, etc.) to KVO once TPN starts this evening  This provides 40 g of protein and 490 kCals per day meeting 26% of protein and 38% of kCal needs Add MVI and trace elements in TPN Add 5 units of regular insulin in TPN Continue sensitive SSI and adjust as needed Monitor TPN labs, will f/u Mg/Phos in the AM  Thank you for allowing pharmacy to be a part of this patient's care.   , PharmD, BCPS Clinical Pharmacist Pager: [phone] Clinical phone for 07/20/2016 from 7a-3:30p: [phone] If after 3:30p, please call main pharmacy at: [phone] 07/20/2016 10:04 AM   

## 2016-07-20 NOTE — Consult Note (Signed)
WOC Nurse wound consult note Reason for Consult: possible Prevena placement and NPWT VAC dressing change first post to midline abdominal wound Wound type:surgical  Measurement: Midline open surgical wound: 22cm x 9cm x 5cm  Right groin closed with staples: 5cm  Wound bed: Midline: clean, pink, moist, subcutaneous tissue Drainage (amount, consistency, odor) Midline: serosanguinous in VAC canister Right groin: serosanguinous, minimal on current dressing Periwound:intact  Dressing procedure/placement/frequency:  2pc of black foam used to fill the midline abdominal wound, drape applied. Bedside nurse to [facility] when new arrives to room (current device malfunctioning). Patient tolerated well, however did need IV pain medication for dressing change today.  WOC nurse was asked to evaluated the right groin wound fori incisional NPWT, this has been draining and is swollen.  Because we would want to know the amounts coming from an incision and if the drainage from an abdominal wound were to change I have chosen not to bridge the two sites. We will provide traditional incisional NPWT since the patient is inpatient and the Prevena dressing are best placed in the OR at the time of the surgery and only last for 7 days (machine will cut off in 7 days).    1pc of Mepitel placed over the closed incision to protect the skin. 1 strip of black foam used over the incision.  Hydrocolloid used along the lateral edge of the dressing, due to being in the groin skin fold and a little moist.  Seal obtained at 125mmHG. Patient tolerated well.  Jessica PA with CCS at the bedside for assessment of each site today.  WOC team will follow along, the midline incision is not complicated so I have updated the orders for the bedside nurses to change the midline dressings on M/W/F.  Incisional NPWT dressing will not be changed again until Friday and WOC nurse will change this.  Marquia Costello [facility] MSN, RN, [employer]OCN,  CNS [phone]

## 2016-07-20 NOTE — Plan of Care (Signed)
Problem: Activity: Goal: Ability to tolerate increased activity will improve Outcome: Progressing Pt helping turn. Unsure of weight bearing status. Will clarify in AM Goal: Mobility will improve Outcome: Progressing More mobile in bed  Problem: Bowel/Gastric: Goal: Gastrointestinal status for postoperative course will improve Outcome: Progressing Hypoactive BS. Having bm's  Problem: Education: Goal: Will regain or maintain usual level of consciousness Outcome: Progressing A&o x4  Problem: Nutrition: Goal: Ability to attain and maintain optimal nutritional status will improve Outcome: Progressing On TPN  Problem: Respiratory: Goal: Ability to maintain adequate ventilation will improve Outcome: Completed/Met Date Met: 07/20/16 Self extubated 07/18/2016  Problem: Urinary Elimination: Goal: Ability to achieve and maintain adequate urine output will improve Outcome: Progressing UOP WNL

## 2016-07-21 DIAGNOSIS — R066 Hiccough: Secondary | ICD-10-CM

## 2016-07-21 LAB — COMPREHENSIVE METABOLIC PANEL
ALT: 76 U/L — ABNORMAL HIGH (ref 17–63)
AST: 135 U/L — AB (ref 15–41)
Albumin: 2.1 g/dL — ABNORMAL LOW (ref 3.5–5.0)
Alkaline Phosphatase: 57 U/L (ref 38–126)
Anion gap: 10 (ref 5–15)
BILIRUBIN TOTAL: 0.7 mg/dL (ref 0.3–1.2)
BUN: 25 mg/dL — AB (ref 6–20)
CHLORIDE: 103 mmol/L (ref 101–111)
CO2: 31 mmol/L (ref 22–32)
Calcium: 8 mg/dL — ABNORMAL LOW (ref 8.9–10.3)
Creatinine, Ser: 1.16 mg/dL (ref 0.61–1.24)
Glucose, Bld: 318 mg/dL — ABNORMAL HIGH (ref 65–99)
POTASSIUM: 4.5 mmol/L (ref 3.5–5.1)
Sodium: 144 mmol/L (ref 135–145)
TOTAL PROTEIN: 4.9 g/dL — AB (ref 6.5–8.1)

## 2016-07-21 LAB — DIFFERENTIAL
BASOS ABS: 0.2 10*3/uL — AB (ref 0.0–0.1)
BASOS PCT: 1 %
EOS ABS: 0 10*3/uL (ref 0.0–0.7)
Eosinophils Relative: 0 %
LYMPHS ABS: 3.4 10*3/uL (ref 0.7–4.0)
LYMPHS PCT: 21 %
MONOS PCT: 15 %
Monocytes Absolute: 2.4 10*3/uL — ABNORMAL HIGH (ref 0.1–1.0)
NEUTROS ABS: 10.2 10*3/uL — AB (ref 1.7–7.7)
Neutrophils Relative %: 63 %

## 2016-07-21 LAB — GLUCOSE, CAPILLARY
GLUCOSE-CAPILLARY: 142 mg/dL — AB (ref 65–99)
GLUCOSE-CAPILLARY: 140 mg/dL — AB (ref 65–99)
Glucose-Capillary: 114 mg/dL — ABNORMAL HIGH (ref 65–99)
Glucose-Capillary: 127 mg/dL — ABNORMAL HIGH (ref 65–99)
Glucose-Capillary: 135 mg/dL — ABNORMAL HIGH (ref 65–99)

## 2016-07-21 LAB — BASIC METABOLIC PANEL
ANION GAP: 7 (ref 5–15)
BUN: 24 mg/dL — ABNORMAL HIGH (ref 6–20)
CHLORIDE: 108 mmol/L (ref 101–111)
CO2: 35 mmol/L — AB (ref 22–32)
Calcium: 8.1 mg/dL — ABNORMAL LOW (ref 8.9–10.3)
Creatinine, Ser: 1.12 mg/dL (ref 0.61–1.24)
GFR calc Af Amer: >60 mL/min (ref >60)
GFR calc non Af Amer: >60 mL/min (ref >60)
GLUCOSE: 138 mg/dL — AB (ref 65–99)
POTASSIUM: 3.8 mmol/L (ref 3.5–5.1)
Sodium: 150 mmol/L — ABNORMAL HIGH (ref 135–145)

## 2016-07-21 LAB — BLOOD CULTURE ID PANEL (REFLEXED)
Acinetobacter baumannii: NOT DETECTED
CANDIDA KRUSEI: NOT DETECTED
CANDIDA TROPICALIS: NOT DETECTED
Candida albicans: NOT DETECTED
Candida glabrata: NOT DETECTED
Candida parapsilosis: NOT DETECTED
ENTEROCOCCUS SPECIES: NOT DETECTED
Enterobacter cloacae complex: NOT DETECTED
Enterobacteriaceae species: NOT DETECTED
Escherichia coli: NOT DETECTED
Haemophilus influenzae: NOT DETECTED
Klebsiella oxytoca: NOT DETECTED
Klebsiella pneumoniae: NOT DETECTED
LISTERIA MONOCYTOGENES: NOT DETECTED
Methicillin resistance: DETECTED — AB
Neisseria meningitidis: NOT DETECTED
PROTEUS SPECIES: NOT DETECTED
Pseudomonas aeruginosa: NOT DETECTED
SERRATIA MARCESCENS: NOT DETECTED
STAPHYLOCOCCUS AUREUS BCID: NOT DETECTED
STAPHYLOCOCCUS SPECIES: DETECTED — AB
STREPTOCOCCUS AGALACTIAE: NOT DETECTED
STREPTOCOCCUS SPECIES: NOT DETECTED
Streptococcus pneumoniae: NOT DETECTED
Streptococcus pyogenes: NOT DETECTED

## 2016-07-21 LAB — URINE CULTURE: Culture: NO GROWTH

## 2016-07-21 LAB — PHOSPHORUS
Phosphorus: 4.4 mg/dL (ref 2.5–4.6)
Phosphorus: 3.2 mg/dL (ref 2.5–4.6)

## 2016-07-21 LAB — CBC
HEMATOCRIT: 38.9 % — AB (ref 39.0–52.0)
Hemoglobin: 12.1 g/dL — ABNORMAL LOW (ref 13.0–17.0)
MCH: 28.9 pg (ref 26.0–34.0)
MCHC: 31.1 g/dL (ref 30.0–36.0)
MCV: 93.1 fL (ref 78.0–100.0)
PLATELETS: 227 10*3/uL (ref 150–400)
RBC: 4.18 MIL/uL — ABNORMAL LOW (ref 4.22–5.81)
RDW: 15.1 % (ref 11.5–15.5)
WBC: 16.2 10*3/uL — AB (ref 4.0–10.5)

## 2016-07-21 LAB — TRIGLYCERIDES: TRIGLYCERIDES: 248 mg/dL — AB (ref <150)

## 2016-07-21 LAB — MAGNESIUM
Magnesium: 2.1 mg/dL (ref 1.7–2.4)
Magnesium: 2 mg/dL (ref 1.7–2.4)

## 2016-07-21 LAB — PREALBUMIN: PREALBUMIN: 14.7 mg/dL — AB (ref 18–38)

## 2016-07-21 MED ORDER — TRACE MINERALS CR-CU-MN-SE-ZN 10-1000-500-60 MCG/ML IV SOLN
INTRAVENOUS | Status: AC
  Administered 2016-07-21: 18:00:00 via INTRAVENOUS
  Filled 2016-07-21: qty 984

## 2016-07-21 MED ORDER — ONDANSETRON HCL 4 MG/2ML IJ SOLN
4.0000 mg | Freq: Four times a day (QID) | INTRAMUSCULAR | Status: DC | PRN
  Administered 2016-07-21: 4 mg via INTRAVENOUS
  Filled 2016-07-21: qty 2

## 2016-07-21 MED ORDER — CHLORPROMAZINE HCL 25 MG/ML IJ SOLN
25.0000 mg | Freq: Three times a day (TID) | INTRAMUSCULAR | Status: DC | PRN
  Administered 2016-07-21 – 2016-07-23 (×4): 25 mg via INTRAMUSCULAR
  Filled 2016-07-21 (×7): qty 1

## 2016-07-21 MED ORDER — METOCLOPRAMIDE HCL 5 MG/ML IJ SOLN
5.0000 mg | Freq: Four times a day (QID) | INTRAMUSCULAR | Status: DC | PRN
  Administered 2016-07-21 (×2): 5 mg via INTRAVENOUS
  Filled 2016-07-21 (×2): qty 2

## 2016-07-21 NOTE — Care Management Note (Addendum)
Case Management Note  Patient Details  Name: Adah PerlShane Q Godwin MRN: [id number] Date of Birth: 02/13/1971  Subjective/Objective:       Pt admitted with incarcerated Rt inguinal hernia repair             Action/Plan:  PTA from home with girlfriend, CM will continue to follow   Expected Discharge Date:                  Expected Discharge Plan:     In-House Referral:     Discharge planning Services  CM Consult  Post Acute Care Choice:    Choice offered to:     DME Arranged:    DME Agency:     HH Arranged:    HH Agency:     Status of Service:  In process, will continue to follow  If discussed at Long Length of Stay Meetings, dates discussed:    Additional Comments: 07/21/2016 Discussed during LOS 07/21/16- remains appropriate for continued stay.  Pt started TPN yesterday.  12/18 VAC applied to R groin.  Tmax 101.6, recultured.  CM spoke with pt - pt alert and oriented.  Pt informed CM that his girlfriend and mother are support system.  Pt stated that if he needed around the clock care at discharge he can discharge to moms - PTA completely independent Cherylann ParrClaxton, Jaeden Westbay S, RN 07/21/2016, 10:18 AM

## 2016-07-21 NOTE — Progress Notes (Signed)
PHARMACY - PHYSICIAN COMMUNICATION CRITICAL VALUE ALERT - BLOOD CULTURE IDENTIFICATION (BCID)  Results for orders placed or performed during the hospital encounter of 07/12/16  Blood Culture ID Panel (Reflexed) (Collected: 07/20/2016 10:50 AM)  Result Value Ref Range   Enterococcus species NOT DETECTED NOT DETECTED   Listeria monocytogenes NOT DETECTED NOT DETECTED   Staphylococcus species DETECTED (A) NOT DETECTED   Staphylococcus aureus NOT DETECTED NOT DETECTED   Methicillin resistance DETECTED (A) NOT DETECTED   Streptococcus species NOT DETECTED NOT DETECTED   Streptococcus agalactiae NOT DETECTED NOT DETECTED   Streptococcus pneumoniae NOT DETECTED NOT DETECTED   Streptococcus pyogenes NOT DETECTED NOT DETECTED   Acinetobacter baumannii NOT DETECTED NOT DETECTED   Enterobacteriaceae species NOT DETECTED NOT DETECTED   Enterobacter cloacae complex NOT DETECTED NOT DETECTED   Escherichia coli NOT DETECTED NOT DETECTED   Klebsiella oxytoca NOT DETECTED NOT DETECTED   Klebsiella pneumoniae NOT DETECTED NOT DETECTED   Proteus species NOT DETECTED NOT DETECTED   Serratia marcescens NOT DETECTED NOT DETECTED   Haemophilus influenzae NOT DETECTED NOT DETECTED   Neisseria meningitidis NOT DETECTED NOT DETECTED   Pseudomonas aeruginosa NOT DETECTED NOT DETECTED   Candida albicans NOT DETECTED NOT DETECTED   Candida glabrata NOT DETECTED NOT DETECTED   Candida krusei NOT DETECTED NOT DETECTED   Candida parapsilosis NOT DETECTED NOT DETECTED   Candida tropicalis NOT DETECTED NOT DETECTED    Name of physician (or Provider) Contacted: Dr Fredricka Bonineonnor  Changes to prescribed antibiotics required: None, likely contaminant  Travis Gould, PharmD, BCPS, BCCCP Clinical Pharmacist Clinical phone for 07/21/2016 from 7a-3:30p: [phone] If after 3:30p, please call main pharmacy at: [phone] 07/21/2016 2:42 PM

## 2016-07-21 NOTE — Consult Note (Signed)
WOC Nurse wound follow up Wound type: surgical right groin Called by bedside nursing staff due to blockage alarm on the machine.  When I arrived no NPWT to this wound. Wound bed: closed surgical incision Drainage (amount, consistency, odor) serosanguinous, minimal.  I was not able to appreciate a blockage in the tubing or at that canister.   Periwound: intact  Dressing procedure/placement/frequency: Removed entire old dressing because of the moisture in this area and the leakage of the fluid around the dressing with no NPWT applied dressing had become very loose  Cleaned site and dried.  Apply hydrocolloid long the edges of the wound and 1/2 of ostomy barrier ring on the proximal and distal ends of the surgical incision.  Covered incision with 1 pc of Mepitel.  1pc of black foam used as a strip over the incision for incisional NPWT.  Hooked back to the same NPWT machine, which at that time it was determined the machine had malfunctioned.  Hooked site to machine connected to the abdominal wound and immediately NPWT reached 125mmHG. Patient tolerated well.   Portable equipment arrived to evaluate machine and will bring new machine to the room. Bedside nurse will hook abdominal wound back up when new NPWT device arrives to the room.    WOC will follow up on this patient in the am to check status of wounds and NPWT.  Helmer Dull [facility] MSN, RN,CWOCN, CNS [phone]

## 2016-07-21 NOTE — Progress Notes (Signed)
PCCM Progress Note    Admission date:  07/12/2016 Consult date:  07/14/2016 Referring provider: Dr. Lindie SpruceWyatt, CCS  CC:  Abdominal pain  HPI:   45 yo male had incarcerated Rt inguinal hernia repair with MESH 12/05 and d/c home.  Developed n/v/d 12/10 and readmitted.  Found to have strangulation of terminal ileum and bowel obstruction requiring laparotomy.  Complicated by septic shock, VDRF.  Subjective:  Extubated, no acute events overnight, does not complain of pain.  Vital signs: BP 118/64   Pulse 98   Temp 99.1 F (37.3 C) (Oral)   Resp (!) 21   Ht 5\' 11"  (1.803 m)   Wt 117.2 kg (258 lb 6.1 oz)   SpO2 99%   BMI 36.04 kg/m   Hemodynamics:    Intake/output: I/O last 3 completed shifts: In: 2919.5 [P.O.:420; I.V.:1529.5; NG/GT:150; IV Piggyback:820] Out: 5035 [Urine:1835; Emesis/NG output:3000; Drains:200]  General: obese male in NAD, alert Neuro: follows commands, speech clear  HEENT: no stridor Cardiovascular: regular, no murmur Lungs: resps even non labored, diminished bases, no wheeze Abdomen:  Midline open abd with VAC, R groin staples intact, site oozing with VAC placement pending Musculoskeletal: no edema Skin: no rashes or lesions   CMP Latest Ref Rng & Units 07/21/2016 07/21/2016 07/20/2016  Glucose 65 - 99 mg/dL 829(F138(H) 621(H318(H) 086(V124(H)  BUN 6 - 20 mg/dL 78(I24(H) 69(G25(H) 29(B22(H)  Creatinine 0.61 - 1.24 mg/dL 2.841.12 1.321.16 4.401.09  Sodium 135 - 145 mmol/L 150(H) 144 144  Potassium 3.5 - 5.1 mmol/L 3.8 4.5 3.6  Chloride 101 - 111 mmol/L 108 103 106  CO2 22 - 32 mmol/L 35(H) 31 26  Calcium 8.9 - 10.3 mg/dL 8.1(L) 8.0(L) 7.7(L)  Total Protein 6.5 - 8.1 g/dL - 4.9(L) -  Total Bilirubin 0.3 - 1.2 mg/dL - 0.7 -  Alkaline Phos 38 - 126 U/L - 57 -  AST 15 - 41 U/L - 135(H) -  ALT 17 - 63 U/L - 76(H) -    CBC Latest Ref Rng & Units 07/21/2016 07/20/2016 07/19/2016  WBC 4.0 - 10.5 K/uL 16.2(H) 17.9(H) 17.4(H)  Hemoglobin 13.0 - 17.0 g/dL 12.1(L) 13.3 12.8(L)  Hematocrit 39.0  - 52.0 % 38.9(L) 41.2 40.2  Platelets 150 - 400 K/uL 227 199 178    ABG    Component Value Date/Time   PHART 7.329 (L) 07/18/2016 1747   PCO2ART 44.2 07/18/2016 1747   PO2ART 100.0 07/18/2016 1747   HCO3 23.1 07/18/2016 1747   TCO2 24 07/18/2016 1747   ACIDBASEDEF 3.0 (H) 07/18/2016 1747   O2SAT 97.0 07/18/2016 1747    CBG (last 3)   Recent Labs  07/20/16 2323 07/21/16 0339 07/21/16 0823  GLUCAP 125* 114* 142*    Imaging: No results found.  Studies:  CT abd/pelvis 12/10 >> Recurrent Rt inguinal hernia with SBO  Cultures: Blood 12/10 >> NEG Sputum 12/12 >> E coli Blood 12/18 >>  Urine 12/18 >>   Antibiotics: Zosyn 12/11 >> Eraxis 12/18 >>   Events: 12/10 open lap 12/12 Fever 105 12/13 return to OR >> wash out 12/15 OR, abd closure, repair R inguinal hernia  12/16 self extubated 12/17 off pressors 12/18 VAC applied to R groin.  Tmax 101.6, recultured.  Lines/tubes: ETT 12/10 >> 12/16  [place] CVL 12/10 >>   I reviewed CXR myself, no acute disease noted.  ASSESSMENT / PLAN:  Strangulated SB after repair of incarcerated right inguinal hernia. - Per surgery  - TPN  Septic shock with peritonitis and E  coli tracheobronchitis. - Day 9/10 of Abx, d/c after 12/20's doses. - Added Eraxis 12/18, continue course for now  - Monitor closely with addition of TPN - Central line to be d/c'd and PICC line placed  Acute respiratory failure with hypoxia. - Monitor respiratory status after extubation  Hypervolemia. Hypernatremia - Hold further diureses at this point, allow to auto-diurese specially with hypernatremia  Hyperglycemia. - SSI - CBGs  Hiccups - Thorazine added  DVT prophylaxis - SQ heparin, SCDs SUP - Pepcid Nutrition - NPO  Goals of care - full code  Resolved problems >> AKI, lactic acidosis, acute encephalopathy   PCCM will follow intermittently.  Discussed with PCCM-NP and bedside RN.  Alyson ReedyWesam G. Adyn Hoes, M.D. [facility]  Pulmonary/Critical Care Medicine. Pager: [phone]. After hours pager: [phone].

## 2016-07-21 NOTE — Progress Notes (Signed)
PHARMACY - ADULT TOTAL PARENTERAL NUTRITION CONSULT NOTE   Pharmacy Consult for TPN Indication: Prolonged Ileus  Patient Measurements: Height: _0  (180.3 cm) Weight: 258 lb 6.1 oz (117.2 kg) IBW/kg (Calculated) : 75.3 TPN AdjBW (KG): 85.7 Body mass index is 36.04 kg/m. Usual Weight:   Assessment: 22 YOM recently admitted 12/5-12/6 with an incarcerated R-inguinal hernia s/p insertion of mesh on 12/5. The patient represented on 12/10 with abdominal pain and N/V and was found to have a SBO - now s/p exploration with small bowel resection and placement of negative pressure dressing 12/11. The patient was taken back to the OR 12/13 for re-exploration and was found to have an ischemic distal small bowel - s/p appendectomy and small bowel anastomosis. The patient was taken back to the OR 12/15 for R-inguinal hernia repair and closure of the abdomen. Pt with NGT in place for decompression. Pharmacy consulted to start TPN for nutritional support 12/18.   GI: Prolonged NPO>7d, s/p hernia repair 12/5, partial colectomy 12/11, s/p appendectomy, SB anastomosis, closure 12/15. NGT in place to suction - output/24h: 1.9L, LBM 12/17.   Endo: No hx. CBGs 114-131 since TPN started. On sSSI q4h (3 units/12h) Lytes: Initial labs this AM drawn from TPN lab - re-drawn by phlebotomy, K 3.8, Phos 3.2, Mg 2, CoCa~9.5, Na 150 - will watch but continue with lytes in bag today Renal: SCr 1.12, CrCl~80-100 ml/min. UOP/24h: 0.5 ml/kg/hr Pulm: 99%/2L-[place] Cards: BP wnl, HR wnl-tachy. No CV meds Hepatobil: AST 135 << 57, ALT 76 << 29 wnl. T bili/alk Phos wnl. TG 248 (12/19) << 694 (12/14) Neuro: No c/o pain ID: Zosyn D#10 + Eraxis D#2 for empiric abdominal coverage & E.coli RCx from 12/12. Foley and CVC removed 12/18, new cx sent. Tmax/24h: 101, WBC 16.2 << 17.9  Best Practices: Hep SQ (VTE px), pepcid (SUP) TPN Access: PICC (placed 12/18) TPN start date: 12/18  Nutritional Goals (per RD recommendation on  12/18): KCal: 2400-2600 kcal Protein: >/= 150g  Current Nutrition:  Clinimix E 4.25/10 @ 40 cc/hr provides 40 g of protein and 490 kCals per day (meeting 27% protein and 20% kcal needs)  Plan:  Change to Clinimix E 5/20 @ 41 ml/hr Hold 20% lipid emulsion for first 7 days for ICU patients per ASPEN guidelines (Start date 12/18)  This provides 49 g of protein and 870 kCals per day meeting 33% of protein and 36% of kCal needs Add MVI and trace elements in TPN Continue with 5 units of regular insulin in TPN Add Famotidine 40 mg to TPN bag (in place of separate IV infusion) Continue sensitive SSI and adjust as needed Monitor TPN labs, will f/u Mg/Phos in the AM  Thank you for allowing pharmacy to be a part of this patient's care.  Alycia Rossetti, PharmD, BCPS Clinical Pharmacist Pager: [phone] Clinical phone for 07/21/2016 from 7a-3:30p: [phone] If after 3:30p, please call main pharmacy at: [phone] 07/21/2016 9:16 AM

## 2016-07-21 NOTE — Progress Notes (Signed)
S: No fevers since 3pm yesterday. Denies pain. Reports flatus/BM. C/o hiccups.  I&O cath'd once yesterday. PICC/TPN started yesterday.  Vitals, labs, intake/output, and orders reviewed at this time. Tmax 99.1, mildly tachy 90s, normotensive, sats fine on [place]. Adequate UOP. NG 1800 out. Bm x 2. WBC 16.2 decreased from yesterday. elytes ok.  Gen: Alert, interactive, no distress H&N: EOMI, atraumatic, neck supple Chest: unlabored respirations, RRR Abd: soft, nontender, nondistended, incisional vac in place with SS output. R groin incision weeping serous fluid.  Ext: warm, mild edema Neuro: grossly normal  Lines/tubes/drains: PICC 12/18, Ng, wound vac  A/P:   Recurrent right inguinal hernia, strangulation of terminal ileum, bowel obstruction s/p RIH repair of incarcerated hernia, insertion of Mesh 07/07/16, Dr. Violeta GelinasBurke Thompson S/p exploration of right groin, reduction of right inguinal hernia, resection of small bowel, placement of 200cm^2 negative pressure dressing; 07/13/16, Dr. Feliciana RossettiLuke Kinsinger Exploratory laparotomy/ ileocecectomy, appendectomy- 07/15/16 Dr Lindie SpruceWyatt s/p Procedure(s): EXPLORATORY LAPAROTOMY, PRIMARY REPAIR OF RIGHT INGUINAL HERNIA , CLOSURE OF ABDOMEN (N/A) 07/17/16 Dr Lindie SpruceWyatt  Cont bowel rest, ng tube decompression - having Bm but high NG output and hiccups. may have ice chips but need to be recorded. Start PRN reglan.  His Right inguinal hernia site - only had sac closure followed by skin closure. Couldn't re-approximate muscle so not surprised he is having serous drainage from incision. Wound vac placed yesterday, appreciate assistance from WOCN  Pan culture pending, Eraxis added yesterday. Pulmonary toilet. Foley out and CVL out.   PT for deconditioning   Phylliss Blakeshelsea Theseus Birnie, MD [facility], [place] Pager [phone]

## 2016-07-22 DIAGNOSIS — R6520 Severe sepsis without septic shock: Secondary | ICD-10-CM

## 2016-07-22 DIAGNOSIS — K659 Peritonitis, unspecified: Secondary | ICD-10-CM

## 2016-07-22 DIAGNOSIS — G9341 Metabolic encephalopathy: Secondary | ICD-10-CM

## 2016-07-22 LAB — GLUCOSE, CAPILLARY
GLUCOSE-CAPILLARY: 117 mg/dL — AB (ref 65–99)
GLUCOSE-CAPILLARY: 128 mg/dL — AB (ref 65–99)
GLUCOSE-CAPILLARY: 128 mg/dL — AB (ref 65–99)
Glucose-Capillary: 123 mg/dL — ABNORMAL HIGH (ref 65–99)
Glucose-Capillary: 124 mg/dL — ABNORMAL HIGH (ref 65–99)
Glucose-Capillary: 140 mg/dL — ABNORMAL HIGH (ref 65–99)
Glucose-Capillary: 145 mg/dL — ABNORMAL HIGH (ref 65–99)

## 2016-07-22 LAB — CBC
HCT: 39.9 % (ref 39.0–52.0)
Hemoglobin: 12.2 g/dL — ABNORMAL LOW (ref 13.0–17.0)
MCH: 28.5 pg (ref 26.0–34.0)
MCHC: 30.6 g/dL (ref 30.0–36.0)
MCV: 93.2 fL (ref 78.0–100.0)
PLATELETS: 237 10*3/uL (ref 150–400)
RBC: 4.28 MIL/uL (ref 4.22–5.81)
RDW: 14.9 % (ref 11.5–15.5)
WBC: 17.8 10*3/uL — ABNORMAL HIGH (ref 4.0–10.5)

## 2016-07-22 LAB — BASIC METABOLIC PANEL
ANION GAP: 6 (ref 5–15)
BUN: 23 mg/dL — AB (ref 6–20)
CO2: 34 mmol/L — ABNORMAL HIGH (ref 22–32)
Calcium: 8.2 mg/dL — ABNORMAL LOW (ref 8.9–10.3)
Chloride: 108 mmol/L (ref 101–111)
Creatinine, Ser: 1.12 mg/dL (ref 0.61–1.24)
Glucose, Bld: 127 mg/dL — ABNORMAL HIGH (ref 65–99)
POTASSIUM: 3.5 mmol/L (ref 3.5–5.1)
Sodium: 148 mmol/L — ABNORMAL HIGH (ref 135–145)

## 2016-07-22 LAB — PHOSPHORUS: Phosphorus: 3.6 mg/dL (ref 2.5–4.6)

## 2016-07-22 LAB — MAGNESIUM: MAGNESIUM: 1.9 mg/dL (ref 1.7–2.4)

## 2016-07-22 MED ORDER — MAGNESIUM SULFATE 2 GM/50ML IV SOLN
2.0000 g | Freq: Once | INTRAVENOUS | Status: AC
  Administered 2016-07-22: 2 g via INTRAVENOUS
  Filled 2016-07-22 (×2): qty 50

## 2016-07-22 MED ORDER — VANCOMYCIN HCL IN DEXTROSE 1-5 GM/200ML-% IV SOLN
1000.0000 mg | Freq: Three times a day (TID) | INTRAVENOUS | Status: DC
  Filled 2016-07-22: qty 200

## 2016-07-22 MED ORDER — SODIUM CHLORIDE 0.9 % IV SOLN
30.0000 meq | Freq: Once | INTRAVENOUS | Status: AC
  Administered 2016-07-22: 30 meq via INTRAVENOUS
  Filled 2016-07-22: qty 15

## 2016-07-22 MED ORDER — VANCOMYCIN HCL 10 G IV SOLR
2000.0000 mg | Freq: Once | INTRAVENOUS | Status: DC
  Administered 2016-07-22: 2000 mg via INTRAVENOUS
  Filled 2016-07-22: qty 2000

## 2016-07-22 MED ORDER — TRACE MINERALS CR-CU-MN-SE-ZN 10-1000-500-60 MCG/ML IV SOLN
INTRAVENOUS | Status: AC
  Administered 2016-07-22: 18:00:00 via INTRAVENOUS
  Filled 2016-07-22: qty 1992

## 2016-07-22 MED ORDER — MAGNESIUM SULFATE 2 GM/50ML IV SOLN
INTRAVENOUS | Status: AC
  Filled 2016-07-22: qty 50

## 2016-07-22 NOTE — Progress Notes (Signed)
PCCM Progress Note    Admission date:  07/12/2016 Consult date:  07/14/2016 Referring provider: Dr. Lindie SpruceWyatt, CCS  CC:  Abdominal pain  HPI:   45 yo male had incarcerated Rt inguinal hernia repair with MESH 12/05 and d/c home.  Developed n/v/d 12/10 and readmitted.  Found to have strangulation of terminal ileum and bowel obstruction requiring laparotomy.  Complicated by septic shock, VDRF.  Subjective:  Remains extubated, no events overnight  Vital signs: BP 120/75   Pulse (!) 120   Temp 100.3 F (37.9 C) (Axillary)   Resp (!) 22   Ht 5\' 11"  (1.803 m)   Wt 111 kg (244 lb 11.4 oz)   SpO2 93%   BMI 34.13 kg/m   Hemodynamics:    Intake/output: I/O last 3 completed shifts: In: 2703.5 [P.O.:1050; I.V.:1423.5; NG/GT:30; IV Piggyback:200] Out: 5975 [Urine:1475; Emesis/NG output:4050; Drains:450]  General: obese male in NAD, alert Neuro: follows commands, speech clear  HEENT: no stridor, [place]/AT, DMM. Cardiovascular: regular, no murmur Lungs: resps even non labored, diminished bases, no wheeze Abdomen:  Midline open abd with VAC, R groin staples intact, site oozing with VAC placement pending Musculoskeletal: no edema Skin: no rashes or lesions   CMP Latest Ref Rng & Units 07/22/2016 07/21/2016 07/21/2016  Glucose 65 - 99 mg/dL 161(W127(H) 960(A138(H) 540(J318(H)  BUN 6 - 20 mg/dL 81(X23(H) 91(Y24(H) 78(G25(H)  Creatinine 0.61 - 1.24 mg/dL 9.561.12 2.131.12 0.861.16  Sodium 135 - 145 mmol/L 148(H) 150(H) 144  Potassium 3.5 - 5.1 mmol/L 3.5 3.8 4.5  Chloride 101 - 111 mmol/L 108 108 103  CO2 22 - 32 mmol/L 34(H) 35(H) 31  Calcium 8.9 - 10.3 mg/dL 8.2(L) 8.1(L) 8.0(L)  Total Protein 6.5 - 8.1 g/dL - - 4.9(L)  Total Bilirubin 0.3 - 1.2 mg/dL - - 0.7  Alkaline Phos 38 - 126 U/L - - 57  AST 15 - 41 U/L - - 135(H)  ALT 17 - 63 U/L - - 76(H)    CBC Latest Ref Rng & Units 07/22/2016 07/21/2016 07/20/2016  WBC 4.0 - 10.5 K/uL 17.8(H) 16.2(H) 17.9(H)  Hemoglobin 13.0 - 17.0 g/dL 12.2(L) 12.1(L) 13.3  Hematocrit  39.0 - 52.0 % 39.9 38.9(L) 41.2  Platelets 150 - 400 K/uL 237 227 199    ABG    Component Value Date/Time   PHART 7.329 (L) 07/18/2016 1747   PCO2ART 44.2 07/18/2016 1747   PO2ART 100.0 07/18/2016 1747   HCO3 23.1 07/18/2016 1747   TCO2 24 07/18/2016 1747   ACIDBASEDEF 3.0 (H) 07/18/2016 1747   O2SAT 97.0 07/18/2016 1747    CBG (last 3)   Recent Labs  07/22/16 0404 07/22/16 0737 07/22/16 1114  GLUCAP 124* 140* 128*    Imaging: No results found.  Studies:  CT abd/pelvis 12/10 >> Recurrent Rt inguinal hernia with SBO  Cultures: Blood 12/10 >> NEG Sputum 12/12 >> E coli Blood 12/18 >>  Urine 12/18 >>   Antibiotics: Zosyn 12/11 >> Eraxis 12/18 >>   Events: 12/10 open lap 12/12 Fever 105 12/13 return to OR >> wash out 12/15 OR, abd closure, repair R inguinal hernia  12/16 self extubated 12/17 off pressors 12/18 VAC applied to R groin.  Tmax 101.6, recultured.  Lines/tubes: ETT 12/10 >> 12/16  [place] CVL 12/10 >>   I reviewed CXR myself, no acute disease noted.  ASSESSMENT / PLAN:  Strangulated SB after repair of incarcerated right inguinal hernia. - Clamp NGT - TPN per pharmacy  Septic shock with peritonitis and E coli  tracheobronchitis. - Day 10/10 of Abx, d/c after 12/20's doses. - Added Eraxis 12/18, continue course for now and until TPN is off. - Monitor closely with addition of TPN - TLC needs to be discontinued when PICC is placed.  Acute respiratory failure with hypoxia. - Monitor respiratory status after extubation  Hypervolemia. Hypernatremia - Hold further diureses at this point, allow to auto-diurese specially with hypernatremia  Hyperglycemia. - SSI - CBGs  Hiccups - Thorazine added  DVT prophylaxis - SQ heparin, SCDs SUP - Pepcid Nutrition - NPO  Goals of care - full code  Resolved problems >> AKI, lactic acidosis, acute encephalopathy   PCCM will sign off, please call back if needed  Discussed with PCCM-NP and bedside  RN.  Alyson ReedyWesam G. Kaushik Maul, M.D. [facility]. Pager: [phone]. After hours pager: [phone].

## 2016-07-22 NOTE — Progress Notes (Signed)
Pt NG clamped 9a, 1330 pt vomited green bile appx 400 cc, NG placed back on suction and MD notified.

## 2016-07-22 NOTE — Evaluation (Signed)
Physical Therapy Evaluation Patient Details Name: Travis PerlShane Q Gould MRN: [id number] DOB: 03/21/1971 Today's Date: 07/22/2016   History of Present Illness  45 yo male had incarcerated Rt inguinal hernia repair with MESH 12/05 and d/c home. Developed n/v/d 12/10 and readmitted. Found to have strangulation of terminal ileum and bowel obstruction requiring laparotomy. Complicated by septic shock, VDRF. Pt underwent smal bowel resenction adn placement of wound vac x2 on 12/10.  Clinical Impression  Pt tolerated ambulation well despite HR in 130-140s during ambulation. Pt mildly impulsive but eager to start moving. Anticipate pt to progress quickly and will most likely not need an AD or follow up PT upon d/c. Acute PT to con't to follow.    Follow Up Recommendations No PT follow up;Supervision/Assistance - 24 hour    Equipment Recommendations   (TBD, anticipate pt to not need anything)    Recommendations for Other Services       Precautions / Restrictions Precautions Precautions: Fall Precaution Comments: watch HR, resting at 125, during ambulation 130s-140s, RN present Restrictions Weight Bearing Restrictions: No      Mobility  Bed Mobility Overal bed mobility: Needs Assistance Bed Mobility: Sit to Sidelying         Sit to sidelying: Mod assist;+2 for safety/equipment General bed mobility comments: max directional v/c's, assist for LE management back into bed, RN assisted with line management  Transfers Overall transfer level: Needs assistance   Transfers: Sit to/from Stand Sit to Stand: Min assist         General transfer comment: v/c's to push up from chair and not pull up on RW  Ambulation/Gait Ambulation/Gait assistance: Mod assist;+2 safety/equipment Ambulation Distance (Feet): 300 Feet Assistive device: Rolling walker (2 wheeled) Gait Pattern/deviations: Step-through pattern;Narrow base of support;Staggering left;Staggering right (pigeoned toed gait) Gait  velocity: wfl   General Gait Details: pt's HR in 130-140s, pt denies feeling SOB or heart racing. Pt SpO2 >92% on RA. pt unsteady requiring modA to stay in middle of hallway, minA for walker management around obstacles, v/c's to look straight ahead.   Stairs            Wheelchair Mobility    Modified Rankin (Stroke Patients Only)       Balance Overall balance assessment: Needs assistance Sitting-balance support: Feet supported;No upper extremity supported Sitting balance-Leahy Scale: Fair     Standing balance support: Bilateral upper extremity supported Standing balance-Leahy Scale: Poor Standing balance comment: needs RW at this time                             Pertinent Vitals/Pain Pain Assessment: No/denies pain    Home Living Family/patient expects to be discharged to:: Private residence Living Arrangements: Non-relatives/Friends (girlfriend) Available Help at Discharge: Family;Available 24 hours/day Type of Home: House Home Access: Stairs to enter   Enter[employer] CorporationEntrance Stairs-Number of Steps: 1 Home Layout: One level Home Equipment: None      Prior Function Level of Independence: Independent         Comments: didn't require AD after first surgery at beginning of month     Hand Dominance   Dominant Hand: Right    Extremity/Trunk Assessment   Upper Extremity Assessment Upper Extremity Assessment: Overall WFL for tasks assessed    Lower Extremity Assessment Lower Extremity Assessment: Generalized weakness (from prolonged bed rest/hospital stay - 10 days)    Cervical / Trunk Assessment Cervical / Trunk Assessment: Other exceptions Cervical / Trunk Exceptions: 2  wound vacs  Communication   Communication: No difficulties  Cognition Arousal/Alertness: Awake/alert Behavior During Therapy: WFL for tasks assessed/performed;Impulsive (mildly impulsive) Overall Cognitive Status: Within Functional Limits for tasks assessed (perseverating on ice  water)                      General Comments General comments (skin integrity, edema, etc.): wound vacs intact    Exercises     Assessment/Plan    PT Assessment Patient needs continued PT services  PT Problem List Decreased strength;Decreased activity tolerance;Decreased balance;Decreased mobility;Decreased knowledge of use of DME;Decreased safety awareness          PT Treatment Interventions DME instruction;Gait training;Stair training;Functional mobility training;Therapeutic activities;Therapeutic exercise    PT Goals (Current goals can be found in the Care Plan section)  Acute Rehab PT Goals Patient Stated Goal: drink and eat PT Goal Formulation: With patient Time For Goal Achievement: 07/29/16 Potential to Achieve Goals: Good    Frequency Min 3X/week   Barriers to discharge        Co-evaluation               End of Session   Activity Tolerance: Patient tolerated treatment well Patient left: in bed Nurse Communication: Mobility status (RN followed PT with chair as a precaution)         Time: [id number] PT Time Calculation (min) (ACUTE ONLY): 32 min   Charges:   PT Evaluation $PT Eval Moderate Complexity: 1 Procedure PT Treatments $Gait Training: 8-22 mins   PT G CodesRozell Searing:        Linnie Mcglocklin M Jorita Bohanon 07/22/2016, 8:18 AM  Lewis ShockAshly Donella Pascarella, PT, DPT Pager #: [phone] Office #: [phone]

## 2016-07-22 NOTE — Progress Notes (Signed)
S: Sitting in chair this morning. Says he feels great and requesting apple juice. Denies pain. Reports flatus/BM. Hiccups improved with thorazine.    Vitals, labs, intake/output, and orders reviewed at this time. Tmax 99.4, tachy, normotensive, sats fine on [place]. Adequate UOP. NG 3L out, but 1050mL PO recorded.  WBC 17.8 increased from yesterday. elytes ok.  Gen: Alert, interactive, no distress H&N: EOMI, atraumatic, neck supple Chest: unlabored respirations, RRR Abd: soft, nontender, nondistended, incisional vac in place with SS output. R groin incision with vac, serous output Ext: warm, no edema Neuro: grossly normal  Lines/tubes/drains: PICC 12/18, Ng, wound vac  A/P:   Recurrent right inguinal hernia, strangulation of terminal ileum, bowel obstruction s/p RIH repair of incarcerated hernia, insertion of Mesh 07/07/16, Dr. Violeta GelinasBurke Thompson S/p exploration of right groin, reduction of right inguinal hernia, resection of small bowel, placement of 200cm^2 negative pressure dressing; 07/13/16, Dr. Feliciana RossettiLuke Kinsinger Exploratory laparotomy/ ileocecectomy, appendectomy- 07/15/16 Dr Lindie SpruceWyatt s/p Procedure(s): EXPLORATORY LAPAROTOMY, PRIMARY REPAIR OF RIGHT INGUINAL HERNIA , CLOSURE OF ABDOMEN (N/A) 07/17/16 Dr Lindie SpruceWyatt  Clamp NG today.  His Right inguinal hernia site - only had sac closure followed by skin closure. Couldn't re-approximate muscle so not surprised he is having serous drainage from incision. Wound vac placed, appreciate assistance from WOCN  Pan culture with 1/2 bottle pos for MRSA, persistent leukocytosis, will add vanc. Eraxis added 12/18. Pulmonary toilet. Foley out and CVL out.   PT for deconditioning   Phylliss Blakeshelsea Pryor Guettler, MD [facility], [place] Pager [phone]

## 2016-07-22 NOTE — Progress Notes (Signed)
PHARMACY - ADULT TOTAL PARENTERAL NUTRITION CONSULT NOTE   Pharmacy Consult for TPN Indication: Prolonged Ileus  Patient Measurements: Height: _0  (180.3 cm) Weight: 244 lb 11.4 oz (111 kg) IBW/kg (Calculated) : 75.3 TPN AdjBW (KG): 84.2 Body mass index is 34.13 kg/m. Usual Weight:   Assessment: 50 YOM recently admitted 12/5-12/6 with an incarcerated R-inguinal hernia s/p insertion of mesh on 12/5. The patient represented on 12/10 with abdominal pain and N/V and was found to have a SBO - now s/p exploration with small bowel resection and placement of negative pressure dressing 12/11. The patient was taken back to the OR 12/13 for re-exploration and was found to have an ischemic distal small bowel - s/p appendectomy and small bowel anastomosis. The patient was taken back to the OR 12/15 for R-inguinal hernia repair and closure of the abdomen. Pt with NGT in place for decompression. Pharmacy consulted to start TPN for nutritional support 12/18.   GI: Prolonged NPO>7d, s/p hernia repair 12/5, partial colectomy 12/11, s/p appendectomy, SB anastomosis, closure 12/15. NGT in place to suction - output/24h: 2.5L, LBM 12/17.   Endo: No hx. CBGs/24h: 127-140. On sSSI q4h (2 units/12h) Lytes: Initial labs this AM drawn from TPN lab - re-drawn by phlebotomy, K 3.5, Phos 3.6, Mg 1.9, CoCa~9.5, Na 148 - will hold electrolytes today to get Na down and will supplement K/Mg Renal: SCr 1.12, CrCl~80-100 ml/min. UOP/24h: 0.4 ml/kg/hr Pulm: 98%/0.5L-[place] Cards: BP wnl, HR wnl-tachy. No CV meds Hepatobil: AST 135 << 57, ALT 76 << 29 wnl. T bili/alk Phos wnl. TG 248 (12/19) << 694 (12/14) Neuro: No c/o pain ID: Zosyn D#11 + Eraxis D#3 for empiric abdominal coverage & E.coli RCx from 12/12. Foley and CVC removed 12/18, new cx sent. Tmax/24h: 99.4, WBC 17.8 << 16.2  Best Practices: Hep SQ (VTE px), pepcid (SUP) TPN Access: PICC (placed 12/18) TPN start date: 12/18  Nutritional Goals (per RD recommendation on  12/18): KCal: 2400-2600 kcal Protein: >/= 150g  Current Nutrition:  Clinimix E 5/20 @ 41 cc/hr  provides 49 g of protein and 870 kCals per day meeting 33% of protein and 36% of kCal needs  Plan:  - Change to Clinimix 5/15 (no electrolytes) @ 83 ml/hr - Hold 20% lipid emulsion for first 7 days for ICU patients per ASPEN guidelines (Start date 12/18)  - This provides 100 g of protein and 1414 kCals per day meeting 67% of protein and 59% of kCal needs - Add MVI and trace elements in TPN - Give KCl 30 mEq in 265 ml and run over 3 hours - Give Magnesium sulfate 2g IV x 1 dose - Continue with 5 units of regular insulin in TPN - Add Famotidine 40 mg to TPN bag (in place of separate IV infusion) - Continue sensitive SSI and adjust as needed - Monitor TPN labs, will f/u Mg/Phos in the AM  Thank you for allowing pharmacy to be a part of this patient's care.  Alycia Rossetti, PharmD, BCPS Clinical Pharmacist Pager: [phone] Clinical phone for 07/22/2016 from 7a-3:30p: [phone] If after 3:30p, please call main pharmacy at: [phone] 07/22/2016 8:00 AM

## 2016-07-23 ENCOUNTER — Inpatient Hospital Stay (HOSPITAL_COMMUNITY): Payer: Medicaid Other

## 2016-07-23 LAB — COMPREHENSIVE METABOLIC PANEL
ALT: 107 U/L — ABNORMAL HIGH (ref 17–63)
ANION GAP: 6 (ref 5–15)
AST: 127 U/L — ABNORMAL HIGH (ref 15–41)
Albumin: 2.1 g/dL — ABNORMAL LOW (ref 3.5–5.0)
Alkaline Phosphatase: 50 U/L (ref 38–126)
BUN: 22 mg/dL — ABNORMAL HIGH (ref 6–20)
CHLORIDE: 108 mmol/L (ref 101–111)
CO2: 31 mmol/L (ref 22–32)
Calcium: 7.7 mg/dL — ABNORMAL LOW (ref 8.9–10.3)
Creatinine, Ser: 0.99 mg/dL (ref 0.61–1.24)
GFR calc non Af Amer: >60 mL/min (ref >60)
Glucose, Bld: 127 mg/dL — ABNORMAL HIGH (ref 65–99)
POTASSIUM: 3.1 mmol/L — AB (ref 3.5–5.1)
SODIUM: 145 mmol/L (ref 135–145)
Total Bilirubin: 0.8 mg/dL (ref 0.3–1.2)
Total Protein: 5.4 g/dL — ABNORMAL LOW (ref 6.5–8.1)

## 2016-07-23 LAB — GLUCOSE, CAPILLARY
GLUCOSE-CAPILLARY: 137 mg/dL — AB (ref 65–99)
GLUCOSE-CAPILLARY: 141 mg/dL — AB (ref 65–99)
GLUCOSE-CAPILLARY: 125 mg/dL — AB (ref 65–99)
Glucose-Capillary: 129 mg/dL — ABNORMAL HIGH (ref 65–99)
Glucose-Capillary: 135 mg/dL — ABNORMAL HIGH (ref 65–99)

## 2016-07-23 LAB — CBC
HEMATOCRIT: 36.2 % — AB (ref 39.0–52.0)
HEMOGLOBIN: 11.6 g/dL — AB (ref 13.0–17.0)
MCH: 29.2 pg (ref 26.0–34.0)
MCHC: 32 g/dL (ref 30.0–36.0)
MCV: 91.2 fL (ref 78.0–100.0)
Platelets: 214 10*3/uL (ref 150–400)
RBC: 3.97 MIL/uL — ABNORMAL LOW (ref 4.22–5.81)
RDW: 14.6 % (ref 11.5–15.5)
WBC: 18.8 10*3/uL — AB (ref 4.0–10.5)

## 2016-07-23 LAB — CULTURE, BLOOD (ROUTINE X 2)

## 2016-07-23 LAB — MAGNESIUM: Magnesium: 1.9 mg/dL (ref 1.7–2.4)

## 2016-07-23 LAB — PHOSPHORUS: PHOSPHORUS: 2.5 mg/dL (ref 2.5–4.6)

## 2016-07-23 MED ORDER — TRACE MINERALS CR-CU-MN-SE-ZN 10-1000-500-60 MCG/ML IV SOLN
INTRAVENOUS | Status: AC
  Administered 2016-07-23: 18:00:00 via INTRAVENOUS
  Filled 2016-07-23: qty 1992

## 2016-07-23 MED ORDER — POTASSIUM CHLORIDE 2 MEQ/ML IV SOLN
Freq: Once | INTRAVENOUS | Status: AC
  Administered 2016-07-23: 08:00:00 via INTRAVENOUS
  Filled 2016-07-23: qty 1000

## 2016-07-23 MED ORDER — MAGNESIUM SULFATE 2 GM/50ML IV SOLN
2.0000 g | Freq: Once | INTRAVENOUS | Status: AC
Start: 2016-07-23 — End: 2016-07-23
  Administered 2016-07-23: 2 g via INTRAVENOUS
  Filled 2016-07-23: qty 50

## 2016-07-23 MED ORDER — FAT EMULSION 20 % IV EMUL
240.0000 mL | INTRAVENOUS | Status: AC
  Administered 2016-07-23: 240 mL via INTRAVENOUS
  Filled 2016-07-23: qty 250

## 2016-07-23 MED ORDER — PHENOL 1.4 % MT LIQD
1.0000 | OROMUCOSAL | Status: DC | PRN
  Filled 2016-07-23: qty 177

## 2016-07-23 NOTE — Progress Notes (Signed)
S: Sitting in chair this morning. Says he feels great and requesting apple juice. Denies pain. Reports flatus/BM. Did not tolerate NG clamping yesterday.   Vitals, labs, intake/output, and orders reviewed at this time. Tmax 99.4, tachy, normotensive, sats fine on [place]. Adequate UOP. NG 3L out, but 1050mL PO recorded.  WBC 18.8 increased from yesterday. hypokalemia.  Gen: Alert, interactive, no distress H&N: EOMI, atraumatic, neck supple Chest: unlabored respirations, RRR Abd: soft, nontender, nondistended, incisional vac in place with SS output. R groin incision with vac, serous output Ext: warm, no edema Neuro: grossly normal  Lines/tubes/drains: PICC 12/18, Ng, wound vac  A/P:   Recurrent right inguinal hernia, strangulation of terminal ileum, bowel obstruction s/p RIH repair of incarcerated hernia, insertion of Mesh 07/07/16, Dr. Violeta GelinasBurke Thompson S/p exploration of right groin, reduction of right inguinal hernia, resection of small bowel, placement of 200cm^2 negative pressure dressing; 07/13/16, Dr. Feliciana RossettiLuke Kinsinger Exploratory laparotomy/ ileocecectomy, appendectomy- 07/15/16 Dr Lindie SpruceWyatt s/p Procedure(s): EXPLORATORY LAPAROTOMY, PRIMARY REPAIR OF RIGHT INGUINAL HERNIA , CLOSURE OF ABDOMEN (N/A) 07/17/16 Dr Lindie SpruceWyatt  Clamp NG intermittently (clamp 2h, suction 1h) today.  His Right inguinal hernia site - only had sac closure followed by skin closure. Couldn't re-approximate muscle so not surprised he is having serous drainage from incision. Wound vac placed, appreciate assistance from WOCN  Pan culture with 1/2 bottle pos for coag neg staph likely contaminant, persistent leukocytosis, low grade temps but no fever. All abx stopped yesterday. CXR and RUQ US today, re-culture if spikes a fever>101.4.   CT scan tomorrow if ongoing ileus and WBC/ negative xr/us.  PT for deconditioning   Phylliss Blakeshelsea Jordanne Elsbury, MD [facility], [place] Pager [phone]

## 2016-07-23 NOTE — Progress Notes (Signed)
[facility] CONSULT NOTE   Pharmacy Consult for TPN Indication: Prolonged Ileus  Patient Measurements: Height: 5' 11"  (180.3 cm) Weight: 244 lb 11.4 oz (111 kg) IBW/kg (Calculated) : 75.3 TPN AdjBW (KG): 84.2 Body mass index is 34.13 kg/m. Usual Weight:   Assessment: 76 YOM recently admitted 12/5-12/6 with an incarcerated R-inguinal hernia s/p insertion of mesh on 12/5. The patient represented on 12/10 with abdominal pain and N/V and was found to have a SBO - now s/p exploration with small bowel resection and placement of negative pressure dressing 12/11. The patient was taken back to the OR 12/13 for re-exploration and was found to have an ischemic distal small bowel - s/p appendectomy and small bowel anastomosis. The patient was taken back to the OR 12/15 for R-inguinal hernia repair and closure of the abdomen. Pt with NGT in place for decompression. Pharmacy consulted to start TPN for nutritional support 12/18.   GI: Prolonged NPO>7d, s/p hernia repair 12/5, partial colectomy 12/11, s/p appendectomy, SB anastomosis, closure 12/15. NGT in place to suction - output/24h: 2175cc- clamping attempted 12/20 however pt did not tolerate (vomited 400 cc green bile). To attempt intermittent clamping today and considering repeat CT on 12/22. LBM 12/17. The patient is no longer consider "critically ill" so will add lipids today to supplement kcal and keep the patient at a 2L Clinimix bag size with the backorder/shortage Endo: No hx. CBGs/24h: 117-145. On sSSI q4h (3 units/12h) Lytes: K 3.1 (s/p 30 mEq on 12/20 with 80 mEq ordered by MD this AM), Phos 2.5, Mg 1.9 (s/p Mg 2g x 1 on 12/20), CoCa~9.1, Na 145 - will add electrolytes back to TPN today and supplement Mg Renal: SCr 0.99, CrCl~80-100 ml/min. UOP not accurately charted Pulm: 97%/RA Cards: BP soft-wnl, HR tachy. No CV meds Hepatobil: AST 127 << 135, ALT 107 << 76. T bili/alk Phos wnl. TG 248 (12/19) << 694  (12/14) Neuro: No c/o pain ID: All abx d/ced. Foley and CVC removed 12/18. Tmax/24h: 100.3, WBC 18.8 << 17.8  Best Practices: Hep SQ (VTE px), pepcid (SUP) TPN Access: PICC (placed 12/18) TPN start date: 12/18  Nutritional Goals (per RD recommendation on 12/18): KCal: 2400-2600 kcal Protein: >/= 150g  Current Nutrition:  Clinimix 5/15 @ 83 cc/hr  provides 100 g of protein and 1414 kCals per day meeting 67% of protein and 59% of kCal needs  Plan:  - Change to Clinimix E 5/15 at 83 ml/hr - Start 20% lipid emulsion at 10 ml/hr (patient no longer "critically ill" and can benefit from the additional kcal to avoid increasing the Clinimix bag size)  - This provides 100 g of protein and 1894 kCals per day meeting 67% of protein and 79% of kCal needs - Given the Clinimix shortage/backorder - will not plan to advance past the current rate to keep the Clinimix bag size at 2L.  - Add MVI and trace elements in TPN - Repeat Magnesium sulfate 2g IV x 1 dose - Continue with 5 units of regular insulin in TPN - Add Famotidine 40 mg to TPN bag (in place of separate IV infusion) - Continue sensitive SSI and adjust as needed - Monitor TPN labs, will f/u Mg/Phos in the AM  Thank you for allowing pharmacy to be a part of this patient's care.  Alycia Rossetti, PharmD, BCPS Clinical Pharmacist Pager: [phone] Clinical phone for 07/23/2016 from 7a-3:30p: [phone] If after 3:30p, please call main pharmacy at: [phone] 07/23/2016 8:14 AM

## 2016-07-23 NOTE — Progress Notes (Signed)
Nutrition Follow Up  DOCUMENTATION CODES:   Obesity unspecified  INTERVENTION:    TPN per pharmacy  NUTRITION DIAGNOSIS:   Inadequate oral intake related to inability to eat as evidenced by NPO status, ongoing  NEW GOAL:   Patient will meet greater than or equal to 90% of their needs, progressing  MONITOR:   Diet advancement, Labs, Weight trends, Skin, I & O's, TPN  ASSESSMENT:   45 y.o. Male with symptoms of nausea vomiting abdominal pain with diarrhea. He presented with acute kidney insufficiency as well as lactic acidosis on CT scanning showed multiple dilated loops of bowel and recurrent right inguinal hernia.  Pt s/p procedures 12/11: EXPLORATION OF RIGHT GROIN STRANGULATION OF TERMINAL ILEUM BOWEL OBSTRUCTION  Pt s/p procedures 12/15: EXPLORATORY LAPAROTOMY,  PRIMARY REPAIR OF RIGHT INGUINAL HERNIA  CLOSURE OF ABDOMEN  NGT clamped 12/20, however, pt vomited >> placed back to LIS. Noted pt requested apple juice this AM >> having flatus/BM's. Labs and medications reviewed. CWOCN note 12/18 reviewed. CBG's [id number].  Patient is receiving TPN via PICC line with Clinimix E 5/15 @ 83 ml/hr.  Lipids (20% IVFE) at 10 ml/hr.  Provides 1414 kcal and 100 grams protein per day.  Meets 59% minimum estimated energy needs and 67% minimum estimated protein needs.  Diet Order:  Diet NPO time specified Except for: Ice Chips TPN [facility](CLINIMIX) Adult without lytes TPN (CLINIMIX-E) Adult  Skin:  Wound (see comment) (abdominal wound VAC)  Last BM:  12/21  Height:   Ht Readings from Last 1 Encounters:  07/22/16 5\' 11"  (1.803 m)    Weight:   Wt Readings from Last 1 Encounters:  07/22/16 244 lb 11.4 oz (111 kg)    Ideal Body Weight:  78.1 kg  BMI:  Body mass index is 34.13 kg/m.  Re-estimated Nutritional Needs:   Kcal:  2400-2600  Protein:  >/= 150 gm  Fluid:  per MD  EDUCATION NEEDS:   No education needs identified at this time  Maureen ChattersKatie Karem Tomaso, RD,  LDN Pager #: [phone] After-Hours Pager #: [phone]

## 2016-07-24 ENCOUNTER — Inpatient Hospital Stay (HOSPITAL_COMMUNITY): Payer: Medicaid Other

## 2016-07-24 LAB — GLUCOSE, CAPILLARY
GLUCOSE-CAPILLARY: 128 mg/dL — AB (ref 65–99)
Glucose-Capillary: 114 mg/dL — ABNORMAL HIGH (ref 65–99)
Glucose-Capillary: 124 mg/dL — ABNORMAL HIGH (ref 65–99)
Glucose-Capillary: 127 mg/dL — ABNORMAL HIGH (ref 65–99)
Glucose-Capillary: 128 mg/dL — ABNORMAL HIGH (ref 65–99)
Glucose-Capillary: 128 mg/dL — ABNORMAL HIGH (ref 65–99)
Glucose-Capillary: 135 mg/dL — ABNORMAL HIGH (ref 65–99)

## 2016-07-24 LAB — CBC
HCT: 33.7 % — ABNORMAL LOW (ref 39.0–52.0)
Hemoglobin: 10.6 g/dL — ABNORMAL LOW (ref 13.0–17.0)
MCH: 28.5 pg (ref 26.0–34.0)
MCHC: 31.5 g/dL (ref 30.0–36.0)
MCV: 90.6 fL (ref 78.0–100.0)
PLATELETS: 218 10*3/uL (ref 150–400)
RBC: 3.72 MIL/uL — AB (ref 4.22–5.81)
RDW: 14.7 % (ref 11.5–15.5)
WBC: 16.7 10*3/uL — AB (ref 4.0–10.5)

## 2016-07-24 LAB — BASIC METABOLIC PANEL
Anion gap: 5 (ref 5–15)
BUN: 16 mg/dL (ref 6–20)
CALCIUM: 7.8 mg/dL — AB (ref 8.9–10.3)
CO2: 25 mmol/L (ref 22–32)
Chloride: 110 mmol/L (ref 101–111)
Creatinine, Ser: 0.83 mg/dL (ref 0.61–1.24)
GFR calc Af Amer: >60 mL/min (ref >60)
GLUCOSE: 136 mg/dL — AB (ref 65–99)
POTASSIUM: 3.5 mmol/L (ref 3.5–5.1)
SODIUM: 140 mmol/L (ref 135–145)

## 2016-07-24 MED ORDER — FAT EMULSION 20 % IV EMUL
240.0000 mL | INTRAVENOUS | Status: AC
  Administered 2016-07-24: 240 mL via INTRAVENOUS
  Filled 2016-07-24: qty 250

## 2016-07-24 MED ORDER — IOPAMIDOL (ISOVUE-300) INJECTION 61%
INTRAVENOUS | Status: AC
  Administered 2016-07-24: 30 mL
  Filled 2016-07-24: qty 30

## 2016-07-24 MED ORDER — TRACE MINERALS CR-CU-MN-SE-ZN 10-1000-500-60 MCG/ML IV SOLN
INTRAVENOUS | Status: AC
  Administered 2016-07-24: 18:00:00 via INTRAVENOUS
  Filled 2016-07-24: qty 1992

## 2016-07-24 MED ORDER — IOPAMIDOL (ISOVUE-300) INJECTION 61%
INTRAVENOUS | Status: AC
  Administered 2016-07-24: 100 mL
  Filled 2016-07-24: qty 100

## 2016-07-24 NOTE — Progress Notes (Signed)
PHARMACY - ADULT TOTAL PARENTERAL NUTRITION CONSULT NOTE   Pharmacy Consult for TPN Indication: Prolonged Ileus  Patient Measurements: Height: 5' 11"  (180.3 cm) Weight: 244 lb 11.4 oz (111 kg) IBW/kg (Calculated) : 75.3 TPN AdjBW (KG): 84.2 Body mass index is 34.13 kg/m.  Assessment: 14 YOM recently admitted 12/5-12/6 with an incarcerated R-inguinal hernia s/p insertion of mesh on 12/5. The patient represented on 12/10 with abdominal pain and N/V and was found to have a SBO - now s/p exploration with small bowel resection and placement of negative pressure dressing 12/11. The patient was taken back to the OR 12/13 for re-exploration and was found to have an ischemic distal small bowel - s/p appendectomy and small bowel anastomosis. The patient was taken back to the OR 12/15 for R-inguinal hernia repair and closure of the abdomen. Pt with NGT in place for decompression. Pharmacy consulted to start TPN for nutritional support 12/18.   GI: Prolonged NPO>7d, s/p hernia repair 12/5, partial colectomy 12/11, s/p appendectomy, SB anastomosis, closure 12/15. NGT in place to suction - output/24h: 1950cc- clamping again for 4 hours today.  Repeat CT today. LBM 12/21. Lipids added yesterday to supplement kcal and keep the patient at a 2L Clinimix bag size with the backorder/shortage  Endo: No hx. CBGs/24h: All < 150. On sSSI q4h (6 units/24h)  Lytes: No new labs yet today - Hypokalemia yesterday but supplemented.  Electrolytes added back to regimen for ongoing replacement.  Renal: Stable, UOP not recorded for full 24 hours.   Pulm: 97%/RA Cards: BP soft-wnl, HR tachy. No CV meds Hepatobil: AST 127 << 135, ALT 107 << 76. T bili/alk Phos wnl. TG 248 (12/19) << 694 (12/14) Neuro: No c/o pain ID: All abx d/ced. Foley and CVC removed 12/18. Tmax/24h: 100.3, WBC 18.8 << 17.8  Best Practices: Hep SQ (VTE px), pepcid (SUP) TPN Access: PICC (placed 12/18) TPN start date: 12/18  Nutritional Goals (per RD  recommendation on 12/18): KCal: 2400-2600 kcal Protein: >/= 150g  Current Nutrition:  Clinimix 5/15 @ 83 cc/hr  provides 100 g of protein and 1414 kCals per day meeting 67% of protein and 59% of kCal needs  Plan:  - Continue Clinimix E 5/15 at 83 ml/hr - Continue 20% lipid emulsion at 10 ml/hr  - Keep MVI/trace elements as ordered - Regular insulin 5 units per bag  - F/U labs, CT, plans to transition to oral/enteral diet - This provides 100 g of protein and 1894 kCals per day meeting 67% of protein and 79% of kCal needs - Given the Clinimix shortage/backorder - will not plan to advance past the current rate to keep the Clinimix bag size at 2L.   Rober Minion, PharmD., MS Clinical Pharmacist Pager:  [phone] Thank you for allowing pharmacy to be part of this patients care team.  07/24/2016 8:22 AM

## 2016-07-24 NOTE — Progress Notes (Signed)
Physical Therapy Treatment Patient Details Name: Travis PerlShane Q Feliz MRN: [id number] DOB: 07/31/1971 Today's Date: 07/24/2016    History of Present Illness 45 yo male had incarcerated Rt inguinal hernia repair with MESH 12/05 and d/c home. Developed n/v/d 12/10 and readmitted. Found to have strangulation of terminal ileum and bowel obstruction requiring laparotomy. Complicated by septic shock, VDRF. Pt underwent smal bowel resenction adn placement of wound vac x2 on 12/10.    PT Comments    Patient seen for mobility progression. Tolerated increased ambulation distance today with less physical assist. Improvements noted in stability and activity tolerance. Current POC remains appropriate.  Follow Up Recommendations  No PT follow up;Supervision/Assistance - 24 hour     Equipment Recommendations   (TBD, anticipate pt to not need anything)    Recommendations for Other Services       Precautions / Restrictions Precautions Precautions: Fall Precaution Comments: watch HR, resting at 125, during ambulation 130s-140s, RN present Restrictions Weight Bearing Restrictions: No    Mobility  Bed Mobility Overal bed mobility: Needs Assistance Bed Mobility: Rolling;Sidelying to Sit;Sit to Sidelying Rolling: Min guard Sidelying to sit: Min assist     Sit to sidelying: Min assist General bed mobility comments: Min assist to pull to sit upon transition to upright, min assist to elevate LEs back to bed upon return to supine  Transfers Overall transfer level: Needs assistance Equipment used: Rolling walker (2 wheeled) Transfers: Sit to/from Stand Sit to Stand: Min guard         General transfer comment: VCs for hand placement, min guard for safety  Ambulation/Gait Ambulation/Gait assistance: Min guard Ambulation Distance (Feet): 610 Feet Assistive device: Rolling walker (2 wheeled) Gait Pattern/deviations: Step-through pattern;Narrow base of support;Staggering left;Staggering  right Gait velocity: wfl   General Gait Details: patient steady with ambulation, tolerated increased distance today. No physical assist required. HR 120s-130s throughout activity.   Stairs            Wheelchair Mobility    Modified Rankin (Stroke Patients Only)       Balance Overall balance assessment: Needs assistance Sitting-balance support: Feet supported;No upper extremity supported Sitting balance-Leahy Scale: Fair     Standing balance support: Bilateral upper extremity supported Standing balance-Leahy Scale: Fair Standing balance comment: use of RW for stability in standing                    Cognition Arousal/Alertness: Awake/alert Behavior During Therapy: WFL for tasks assessed/performed;Impulsive Overall Cognitive Status: Within Functional Limits for tasks assessed                      Exercises      General Comments General comments (skin integrity, edema, etc.): wound vacs intact      Pertinent Vitals/Pain Pain Assessment: No/denies pain    Home Living                      Prior Function            PT Goals (current goals can now be found in the care plan section) Acute Rehab PT Goals Patient Stated Goal: drink and eat PT Goal Formulation: With patient Time For Goal Achievement: 07/29/16 Potential to Achieve Goals: Good Progress towards PT goals: Progressing toward goals    Frequency    Min 3X/week      PT Plan Current plan remains appropriate    Co-evaluation  End of Session Equipment Utilized During Treatment: Gait belt Activity Tolerance: Patient tolerated treatment well Patient left: in bed     Time: 0929-0950 PT Time Calculation (min) (ACUTE ONLY): 21 min  Charges:  $Gait Training: 8-22 mins                    G Codes:      Fabio AsaDevon J Raydel Hosick 07/24/2016, 3:45 PM Charlotte Crumbevon Adyline Huberty, PT DPT  [phone]

## 2016-07-24 NOTE — Progress Notes (Signed)
S: feels well this morning. Continues to have bowel movements. Tolerated 2hr clamping trials yesterday.   Vitals, labs, intake/output, and orders reviewed at this time. Tmax 99.4, tachy, normotensive, sats fine on [place]. Adequate UOP. NG 1050 out.  Labs pending  Gen: Alert, interactive, no distress H&N: EOMI, atraumatic, neck supple Chest: unlabored respirations, RRR Abd: soft, nontender, nondistended, incisional vac in place with SS output. R groin incision with vac, serous output Ext: warm, no edema Neuro: grossly normal  Lines/tubes/drains: PICC 12/18, Ng, wound vac  A/P:   Recurrent right inguinal hernia, strangulation of terminal ileum, bowel obstruction s/p RIH repair of incarcerated hernia, insertion of Mesh 07/07/16, Dr. Violeta GelinasBurke Thompson S/p exploration of right groin, reduction of right inguinal hernia, resection of small bowel, placement of 200cm^2 negative pressure dressing; 07/13/16, Dr. Feliciana RossettiLuke Kinsinger Exploratory laparotomy/ ileocecectomy, appendectomy- 07/15/16 Dr Lindie SpruceWyatt s/p Procedure(s): EXPLORATORY LAPAROTOMY, PRIMARY REPAIR OF RIGHT INGUINAL HERNIA , CLOSURE OF ABDOMEN (N/A) 07/17/16 Dr Lindie SpruceWyatt  Clamp NG intermittently (clamp 4h, suction 1h) today.  His Right inguinal hernia site - only had sac closure followed by skin closure. Couldn't re-approximate muscle so not surprised he is having serous drainage from incision. Wound vac placed, appreciate assistance from WOCN  Pan culture with 1/2 bottle pos for coag neg staph likely contaminant, persistent leukocytosis, low grade temps but no fever. All abx stopped 12/20. CXR and RUQ US negative yesterday, re-culture if spikes a fever>101.4.   CT scan today.  PT for deconditioning   Phylliss Blakeshelsea Amany Rando, MD [facility], [place] Pager [phone]

## 2016-07-24 NOTE — Consult Note (Signed)
WOC Nurse wound follow up Wound type: surgical  Measurement: 5cm closed incision Wound bed: closed incision Drainage (amount, consistency, odor) leaking lymphatic fluid Periwound: intact  Dressing procedure/placement/frequency: 1/2 of ostomy barrier ring used at the medial and lateral  edges of the incision.  Hydrocolloid strips used along the distal and proximal edges of the wound.  1pc of Mepitel used over the incision. 1pc of black foam used over the site. Seal at 125mmHG. Patient tolerated well.    Bedside nurse to change midline dressing   WOC to follow for weekly incisional NPWT dressing changes every 5 days.   Zayyan Mullen [facility] MSN, RN,CWOCN, CNS [phone]

## 2016-07-25 ENCOUNTER — Encounter (HOSPITAL_COMMUNITY): Payer: Self-pay

## 2016-07-25 LAB — CBC
HCT: 33 % — ABNORMAL LOW (ref 39.0–52.0)
Hemoglobin: 10.5 g/dL — ABNORMAL LOW (ref 13.0–17.0)
MCH: 28.5 pg (ref 26.0–34.0)
MCHC: 31.8 g/dL (ref 30.0–36.0)
MCV: 89.7 fL (ref 78.0–100.0)
PLATELETS: 214 10*3/uL (ref 150–400)
RBC: 3.68 MIL/uL — ABNORMAL LOW (ref 4.22–5.81)
RDW: 14.6 % (ref 11.5–15.5)
WBC: 15 10*3/uL — ABNORMAL HIGH (ref 4.0–10.5)

## 2016-07-25 LAB — GLUCOSE, CAPILLARY
GLUCOSE-CAPILLARY: 131 mg/dL — AB (ref 65–99)
GLUCOSE-CAPILLARY: 114 mg/dL — AB (ref 65–99)
GLUCOSE-CAPILLARY: 117 mg/dL — AB (ref 65–99)
Glucose-Capillary: 108 mg/dL — ABNORMAL HIGH (ref 65–99)
Glucose-Capillary: 124 mg/dL — ABNORMAL HIGH (ref 65–99)
Glucose-Capillary: 125 mg/dL — ABNORMAL HIGH (ref 65–99)

## 2016-07-25 LAB — CULTURE, BLOOD (ROUTINE X 2): Culture: NO GROWTH

## 2016-07-25 LAB — BASIC METABOLIC PANEL
ANION GAP: 4 — AB (ref 5–15)
BUN: 13 mg/dL (ref 6–20)
CALCIUM: 7.9 mg/dL — AB (ref 8.9–10.3)
CO2: 27 mmol/L (ref 22–32)
CREATININE: 0.77 mg/dL (ref 0.61–1.24)
Chloride: 109 mmol/L (ref 101–111)
GFR calc Af Amer: >60 mL/min (ref >60)
GLUCOSE: 129 mg/dL — AB (ref 65–99)
Potassium: 3.5 mmol/L (ref 3.5–5.1)
Sodium: 140 mmol/L (ref 135–145)

## 2016-07-25 MED ORDER — CLINIMIX E/DEXTROSE (5/15) 5 % IV SOLN
INTRAVENOUS | Status: DC
  Administered 2016-07-25: 18:00:00 via INTRAVENOUS
  Filled 2016-07-25: qty 1992

## 2016-07-25 MED ORDER — FAT EMULSION 20 % IV EMUL
240.0000 mL | INTRAVENOUS | Status: DC
  Administered 2016-07-25: 240 mL via INTRAVENOUS
  Filled 2016-07-25: qty 250

## 2016-07-25 NOTE — Progress Notes (Addendum)
PHARMACY - ADULT TOTAL PARENTERAL NUTRITION CONSULT NOTE   Pharmacy Consult for TPN Indication: Prolonged Ileus  Patient Measurements: Height: _0  (180.3 cm) Weight: 244 lb 11.4 oz (111 kg) IBW/kg (Calculated) : 75.3 TPN AdjBW (KG): 84.2 Body mass index is 34.13 kg/m.  Assessment: 16 YOM recently admitted 12/5-12/6 with an incarcerated R-inguinal hernia s/p insertion of mesh on 12/5. The patient represented on 12/10 with abdominal pain and N/V and was found to have a SBO - now s/p exploration with small bowel resection and placement of negative pressure dressing 12/11. The patient was taken back to the OR 12/13 for re-exploration and was found to have an ischemic distal small bowel - s/p appendectomy and small bowel anastomosis. The patient was taken back to the OR 12/15 for R-inguinal hernia repair and closure of the abdomen. Pt with NGT in place for decompression. Pharmacy consulted to start TPN for nutritional support 12/18.   GI: Prolonged NPO>7d, s/p hernia repair 12/5, partial colectomy 12/11, s/p appendectomy, SB anastomosis, closure 12/15. NGT in place to suction - output/24h: 300cc- clamping again for 4 hours today.  Repeat CT 12/22 - still with SBO/mesenteric edema and scattered ascites. Marland Kitchen LBM 12/22. Lipids added to supplement kcal and keep the patient at a 2L Clinimix bag size with the backorder/shortage  Endo: No hx. CBGs/24h: All < 150. On sSSI q4h (5 units/24h)  Lytes: Lytes WNL - but low end of K+ range - will supplement as needed.  Corrected calcium = 9.4 (Corrected calcium = serum calcium + 0.8 * (4 - serum albumin)  Renal: Stable, UOP good - not sure it was recorded a full 24 hr.   Pulm: 97%/RA Cards: BP soft-wnl, HR tachy. No CV meds Hepatobil: AST 127 << 135, ALT 107 << 76. T bili/alk Phos wnl. TG 248 (12/19) << 694 (12/14) Neuro: No c/o pain ID: All abx d/ced. Foley and CVC removed 12/18. Tmax/24h: 99.8, WBC down some but still mildly elevated (15.0).  Best  Practices: Hep SQ (VTE px), pepcid (SUP) TPN Access: PICC (placed 12/18) TPN start date: 12/18  Nutritional Goals (per RD recommendation on 12/18): KCal: 2400-2600 kcal Protein: >/= 150g  Current Nutrition:  Clinimix 5/15 @ 83 cc/hr  provides 100 g of protein and 1414 kCals per day meeting 67% of protein and 59% of kCal needs  Plan:  - Continue Clinimix E 5/15 at 83 ml/hr - Continue 20% lipid emulsion at 10 ml/hr  - Keep MVI/trace elements as ordered - Regular insulin 5 units per bag  - Famotidine 46m daily - F/U labs, CT, plans to transition to oral/enteral diet - This provides 100 g of protein and 1894 kCals per day meeting 67% of protein and 79% of kCal needs - Given the Clinimix shortage/backorder - will not plan to advance past the current rate to keep the Clinimix bag size at 2L.   NRober Minion PharmD., MS Clinical Pharmacist Pager:  [phone] you for allowing pharmacy to be part of this patients care team.  Asked to reduce rate to 569mhr - will change new bag to this rate since it has been made. Would like to avoid wasting Clinimix since it is on back-order.  07/25/2016 7:23 AM

## 2016-07-25 NOTE — Progress Notes (Signed)
[facility] Progress Note 8 Days Post-Op  Subjective: Patient looks great.  CT showed some partial obstruction problems, but clinically he does not seem obstructed.  Objective: Vital signs in last 24 hours: Temp:  [98.2 F (36.8 C)-99.8 F (37.7 C)] 98.5 F (36.9 C) (12/23 0330) Pulse Rate:  [98-108] 106 (12/23 0800) Resp:  [16-30] 19 (12/23 0800) BP: (93-132)/(65-78) 117/75 (12/23 0800) SpO2:  [96 %-100 %] 97 % (12/23 0800) Last BM Date: 07/24/16  Intake/Output from previous day: 12/22 0701 - 12/23 0700 In: 2432 [I.V.:2402; NG/GT:30] Out: 1600 [Urine:1300; Emesis/NG output:200; Drains:100] Intake/Output this shift: Total I/O In: 103 [I.V.:103] Out: -   General: No acute distress.  Has tolerated NGT being clamped.  Lungs: Clear  Abd: soft, good bowel sounds.  Having bowel movements.  Extremities: No clinical signs or symptoms of DVT  Neuro: Intact  Lab Results:  @LABLAST2 (wbc:2,hgb:2,hct:2,plt:2) BMET ) Recent Labs  07/24/16 0946 07/25/16 0330  NA 140 140  K 3.5 3.5  CL 110 109  CO2 25 27  GLUCOSE 136* 129*  BUN 16 13  CREATININE 0.83 0.77  CALCIUM 7.8* 7.9*   PT/INR No results for input(s): LABPROT, INR in the last 72 hours. ABG No results for input(s): PHART, HCO3 in the last 72 hours.  Invalid input(s): PCO2, PO2  Studies/Results: Dg Chest 2 View  Result Date: 07/23/2016 CLINICAL DATA:  Leukocytosis EXAM: CHEST  2 VIEW COMPARISON:  July 19, 2016 FINDINGS: There is persistent elevation of the left hemidiaphragm. There is left base atelectasis. Lungs elsewhere are clear. Heart size and pulmonary vascularity are normal. No adenopathy. Nasogastric tube tip and side port are in the proximal stomach. Central catheter tip is in the superior vena cava. No pneumothorax. No bone lesions. IMPRESSION: Tube and catheter positions as described without pneumothorax. Left base atelectasis. Lungs elsewhere clear. Electronically Signed   By: Bretta BangWilliam  Woodruff III  M.D.   On: 07/23/2016 09:32   Ct Abdomen Pelvis W Contrast  Result Date: 07/24/2016 CLINICAL DATA:  Leukocytosis. EXAM: CT ABDOMEN AND PELVIS WITH CONTRAST TECHNIQUE: Multidetector CT imaging of the abdomen and pelvis was performed using the standard protocol following bolus administration of intravenous contrast. CONTRAST:  100mL ISOVUE-300 IOPAMIDOL (ISOVUE-300) INJECTION 61% COMPARISON:  07/12/2016. FINDINGS: Lower chest: New collapse/ consolidation in the left lower lobe with a tiny left pleural effusion. Minimal atelectasis in the right lower lobe. Heart size normal. No pericardial effusion. Nasogastric tube is seen in the esophagus. Hepatobiliary: Liver and gallbladder are unremarkable. No biliary ductal dilatation. Pancreas: Negative. Spleen: Negative. Adrenals/Urinary Tract: Adrenal glands are unremarkable. Question mild heterogeneity/striation within the kidneys on nephrographic phase imaging. Ureters are decompressed. Bladder is grossly unremarkable. Stomach/Bowel: Nasogastric tube terminates in the stomach. Persistent dilatation of mid and distal small bowel to the level of the ileocolic anastomosis (surgery 21/30/865712/13/2017). Colon is otherwise decompressed. Associated mesenteric edema. Vascular/Lymphatic: Vascular structures are unremarkable. Lymph nodes are not enlarged by CT size criteria. Reproductive: Prostate is visualized. Other: Right inguinal hernia contains fat and fluid. Mesenteric edema and scattered ascites, new. Postoperative changes in the ventral midline. Musculoskeletal: No worrisome lytic or sclerotic lesions. IMPRESSION: 1. Small bowel obstruction to the level of an ileocolic anastomosis, with associated mesenteric edema. Ischemia cannot be excluded. These results will be called to the ordering clinician or representative by the Radiologist Assistant, and communication documented in the PACS or zVision Dashboard. 2. Scattered ascites. 3. New collapse/consolidation in the left lower lobe  with a tiny left pleural effusion. 4. Question mild heterogeneous/striation  within the kidneys on nephrographic phase imaging. Correlation with urinalysis is suggested as pyelonephritis cannot be excluded. 5. Right inguinal hernia contains fat and fluid. Electronically Signed   By: Leanna BattlesMelinda  Blietz M.D.   On: 07/24/2016 13:43   [place] Abdomen Limited Ruq  Result Date: 07/23/2016 CLINICAL DATA:  Leukocytosis. EXAM: US ABDOMEN LIMITED - RIGHT UPPER QUADRANT COMPARISON:  None. FINDINGS: Gallbladder: Contracted gallbladder partially limiting evaluation and accentuating gallbladder wall thickness. No gallstones or significant wall thickening visualized. No sonographic Murphy sign noted by sonographer. Common bile duct: Diameter: 3 mm Liver: No focal lesion identified. Within normal limits in parenchymal echogenicity. IMPRESSION: No cholelithiasis or sonographic evidence of acute cholecystitis. Electronically Signed   By: Elige KoHetal  Patel   On: 07/23/2016 09:27    Anti-infectives: Anti-infectives    Start     Dose/Rate Route Frequency Ordered Stop   07/22/16 1800  vancomycin (VANCOCIN) IVPB 1000 mg/200 mL premix  Status:  Discontinued     1,000 mg 200 mL/hr over 60 Minutes Intravenous Every 8 hours 07/22/16 0849 07/22/16 1104   07/22/16 0900  vancomycin (VANCOCIN) 2,000 mg in sodium chloride 0.9 % 500 mL IVPB  Status:  Discontinued     2,000 mg 250 mL/hr over 120 Minutes Intravenous  Once 07/22/16 0844 07/22/16 1104   07/21/16 1100  anidulafungin (ERAXIS) 100 mg in sodium chloride 0.9 % 100 mL IVPB     100 mg over 90 Minutes Intravenous Every 24 hours 07/20/16 1058 07/22/16 1230   07/20/16 1200  anidulafungin (ERAXIS) 200 mg in sodium chloride 0.9 % 200 mL IVPB     200 mg over 180 Minutes Intravenous  Once 07/20/16 1058 07/20/16 1508   07/13/16 0600  piperacillin-tazobactam (ZOSYN) IVPB 3.375 g     3.375 g 12.5 mL/hr over 240 Minutes Intravenous Every 8 hours 07/13/16 0225 07/22/16 2359   07/12/16 2300   piperacillin-tazobactam (ZOSYN) IVPB 3.375 g     3.375 g 100 mL/hr over 30 Minutes Intravenous  Once 07/12/16 2246 07/12/16 2309   07/12/16 2300  vancomycin (VANCOCIN) 2,000 mg in sodium chloride 0.9 % 500 mL IVPB     2,000 mg 250 mL/hr over 120 Minutes Intravenous  Once 07/12/16 2246 07/13/16 0101      Assessment/Plan: s/p Procedure(s): EXPLORATORY LAPAROTOMY, PRIMARY REPAIR OF RIGHT INGUINAL HERNIA , CLOSURE OF ABDOMEN Advance diet discontinue NGT  Clear liquids.  LOS: 12 days   Marta LamasJames O. Gae BonWyatt, III, MD, FACS [phone]--pager [phone]--office [facility] 07/25/2016

## 2016-07-26 LAB — GLUCOSE, CAPILLARY
GLUCOSE-CAPILLARY: 111 mg/dL — AB (ref 65–99)
GLUCOSE-CAPILLARY: 117 mg/dL — AB (ref 65–99)
GLUCOSE-CAPILLARY: 108 mg/dL — AB (ref 65–99)
GLUCOSE-CAPILLARY: 121 mg/dL — AB (ref 65–99)
GLUCOSE-CAPILLARY: 123 mg/dL — AB (ref 65–99)
Glucose-Capillary: 115 mg/dL — ABNORMAL HIGH (ref 65–99)

## 2016-07-26 MED ORDER — ALTEPLASE 2 MG IJ SOLR
2.0000 mg | Freq: Once | INTRAMUSCULAR | Status: AC
  Administered 2016-07-26: 2 mg
  Filled 2016-07-26: qty 2

## 2016-07-26 NOTE — Progress Notes (Signed)
9 Days Post-Op   Subjective: States he is doing well. He is having frequent loose bowel movements. No nausea. Tolerating clear liquids without difficulty. Denies pain.  Objective: Vital signs in last 24 hours: Temp:  [98.1 F (36.7 C)-99.2 F (37.3 C)] 98.8 F (37.1 C) (12/24 0517) Pulse Rate:  [97-110] 109 (12/24 0517) Resp:  [15-24] 21 (12/24 0517) BP: (97-115)/(52-97) 108/52 (12/24 0517) SpO2:  [96 %-100 %] 100 % (12/24 0517) Last BM Date: 07/25/16  Intake/Output from previous day: 12/23 0701 - 12/24 0700 In: 1660.8 [P.O.:232; I.V.:1428.8] Out: 75 [Drains:75] Intake/Output this shift: No intake/output data recorded.  General appearance: alert, cooperative and no distress GI: Soft with appropriate tenderness around incisions Incision/Wound: Wound vacs in place mid abdomen and right groin without evidence of infection  Lab Results:   Recent Labs  07/24/16 0946 07/25/16 0330  WBC 16.7* 15.0*  HGB 10.6* 10.5*  HCT 33.7* 33.0*  PLT 218 214   BMET  Recent Labs  07/24/16 0946 07/25/16 0330  NA 140 140  K 3.5 3.5  CL 110 109  CO2 25 27  GLUCOSE 136* 129*  BUN 16 13  CREATININE 0.83 0.77  CALCIUM 7.8* 7.9*     Studies/Results: Ct Abdomen Pelvis W Contrast  Result Date: 07/24/2016 CLINICAL DATA:  Leukocytosis. EXAM: CT ABDOMEN AND PELVIS WITH CONTRAST TECHNIQUE: Multidetector CT imaging of the abdomen and pelvis was performed using the standard protocol following bolus administration of intravenous contrast. CONTRAST:  100mL ISOVUE-300 IOPAMIDOL (ISOVUE-300) INJECTION 61% COMPARISON:  07/12/2016. FINDINGS: Lower chest: New collapse/ consolidation in the left lower lobe with a tiny left pleural effusion. Minimal atelectasis in the right lower lobe. Heart size normal. No pericardial effusion. Nasogastric tube is seen in the esophagus. Hepatobiliary: Liver and gallbladder are unremarkable. No biliary ductal dilatation. Pancreas: Negative. Spleen: Negative.  Adrenals/Urinary Tract: Adrenal glands are unremarkable. Question mild heterogeneity/striation within the kidneys on nephrographic phase imaging. Ureters are decompressed. Bladder is grossly unremarkable. Stomach/Bowel: Nasogastric tube terminates in the stomach. Persistent dilatation of mid and distal small bowel to the level of the ileocolic anastomosis (surgery 13/24/401012/13/2017). Colon is otherwise decompressed. Associated mesenteric edema. Vascular/Lymphatic: Vascular structures are unremarkable. Lymph nodes are not enlarged by CT size criteria. Reproductive: Prostate is visualized. Other: Right inguinal hernia contains fat and fluid. Mesenteric edema and scattered ascites, new. Postoperative changes in the ventral midline. Musculoskeletal: No worrisome lytic or sclerotic lesions. IMPRESSION: 1. Small bowel obstruction to the level of an ileocolic anastomosis, with associated mesenteric edema. Ischemia cannot be excluded. These results will be called to the ordering clinician or representative by the Radiologist Assistant, and communication documented in the PACS or zVision Dashboard. 2. Scattered ascites. 3. New collapse/consolidation in the left lower lobe with a tiny left pleural effusion. 4. Question mild heterogeneous/striation within the kidneys on nephrographic phase imaging. Correlation with urinalysis is suggested as pyelonephritis cannot be excluded. 5. Right inguinal hernia contains fat and fluid. Electronically Signed   By: Leanna BattlesMelinda  Blietz M.D.   On: 07/24/2016 13:43    Anti-infectives: Anti-infectives    Start     Dose/Rate Route Frequency Ordered Stop   07/22/16 1800  vancomycin (VANCOCIN) IVPB 1000 mg/200 mL premix  Status:  Discontinued     1,000 mg 200 mL/hr over 60 Minutes Intravenous Every 8 hours 07/22/16 0849 07/22/16 1104   07/22/16 0900  vancomycin (VANCOCIN) 2,000 mg in sodium chloride 0.9 % 500 mL IVPB  Status:  Discontinued     2,000 mg 250 mL/hr over  120 Minutes Intravenous  Once  07/22/16 0844 07/22/16 1104   07/21/16 1100  anidulafungin (ERAXIS) 100 mg in sodium chloride 0.9 % 100 mL IVPB     100 mg over 90 Minutes Intravenous Every 24 hours 07/20/16 1058 07/22/16 1230   07/20/16 1200  anidulafungin (ERAXIS) 200 mg in sodium chloride 0.9 % 200 mL IVPB     200 mg over 180 Minutes Intravenous  Once 07/20/16 1058 07/20/16 1508   07/13/16 0600  piperacillin-tazobactam (ZOSYN) IVPB 3.375 g     3.375 g 12.5 mL/hr over 240 Minutes Intravenous Every 8 hours 07/13/16 0225 07/22/16 2359   07/12/16 2300  piperacillin-tazobactam (ZOSYN) IVPB 3.375 g     3.375 g 100 mL/hr over 30 Minutes Intravenous  Once 07/12/16 2246 07/12/16 2309   07/12/16 2300  vancomycin (VANCOCIN) 2,000 mg in sodium chloride 0.9 % 500 mL IVPB     2,000 mg 250 mL/hr over 120 Minutes Intravenous  Once 07/12/16 2246 07/13/16 0101      Assessment/Plan: s/p Procedure(s): EXPLORATORY LAPAROTOMY, PRIMARY REPAIR OF RIGHT INGUINAL HERNIA , CLOSURE OF ABDOMEN Appears to be progressing well. Having frequent loose bowel movements, probable resolution of ileus. Advance diet as tolerated. Chest x-ray tomorrow to follow-up on left lung consolidation seen on CT.    LOS: 13 days    Gracey Tolle T 07/26/2016

## 2016-07-26 NOTE — Progress Notes (Signed)
[facility] CONSULT NOTE   Pharmacy Consult for TPN Indication: Prolonged Ileus  Patient Measurements: Height: 5' 11"  (180.3 cm) Weight: 244 lb 11.4 oz (111 kg) IBW/kg (Calculated) : 75.3 TPN AdjBW (KG): 84.2 Body mass index is 34.13 kg/m.  Assessment: 53 YOM recently admitted 12/5-12/6 with an incarcerated R-inguinal hernia s/p insertion of mesh on 12/5. The patient re-presented on 12/10 with abdominal pain and N/V and was found to have a SBO - now s/p exploration with small bowel resection and placement of negative pressure dressing 12/11. The patient was taken back to the OR 12/13 for re-exploration and was found to have an ischemic distal small bowel - s/p appendectomy and small bowel anastomosis. A right inguinal hernia repair and closure of abdomen were done in the OR 12/15.  Pt with NGT in place for decompression. Pharmacy consulted to start TPN for nutritional support 12/18.   GI: Prolonged NPO>7d, Repeat CT 12/22- partial obstruction, but patient having BMs and clinically improved. NGT removed 12/23. Clear liquids initiated. LBM 12/23.  Endo: No hx. CBGs/24h: All < 150. On sSSI q4h (1 units/24h)  Lytes: Lytes WNL - but low end of K+ range - will supplement as needed.  Corrected calcium = 9.4  Renal: SCr stable, UOP good - not sure it was recorded a full 24 hr.   Pulm: 100%/RA Cards: BP soft-wnl, ST low 100s to 120s. No CV meds Hepatobil: LFTs elevated- trending down. T bili/alk Phos wnl. TG down 649>>248 (12/19) Neuro: No c/o pain ID: All abx d/ced. Foley and CVC removed 12/18. Tmax/24h: 99.8, WBC down some but still mildly elevated (15.0).  Best Practices: Hep SQ (VTE px), pepcid (SUP) TPN Access: PICC (placed 12/18) TPN start date: 12/18  Nutritional Goals (per RD recommendation on 12/18): KCal: 2400-2600 kcal Protein: >/= 150g  Current Nutrition:  Clinimix E 5/15 @ 50 cc/hr  provides 60 g of protein and 1329 kCals (with lipids) per day  meeting 40% of protein and 55% of kCal needs (rate decreased 12/23 to encourage po intake). Clear liquid diet 12/23 >> advancing 12/24  Plan:  Discussed with Dr. Excell Seltzer - stop TPN, advancing diet.  Discontinue TPN labs and consult.   Sloan Leiter, PharmD, BCPS Clinical Pharmacist [phone] until 3:30 PM [phone] after hours 07/26/2016 7:05 AM

## 2016-07-27 ENCOUNTER — Inpatient Hospital Stay (HOSPITAL_COMMUNITY): Payer: Medicaid Other

## 2016-07-27 LAB — CBC
HCT: 32.2 % — ABNORMAL LOW (ref 39.0–52.0)
HEMOGLOBIN: 10.3 g/dL — AB (ref 13.0–17.0)
MCH: 28.9 pg (ref 26.0–34.0)
MCHC: 32 g/dL (ref 30.0–36.0)
MCV: 90.4 fL (ref 78.0–100.0)
PLATELETS: 260 10*3/uL (ref 150–400)
RBC: 3.56 MIL/uL — AB (ref 4.22–5.81)
RDW: 15.1 % (ref 11.5–15.5)
WBC: 11.9 10*3/uL — AB (ref 4.0–10.5)

## 2016-07-27 LAB — GLUCOSE, CAPILLARY
GLUCOSE-CAPILLARY: 114 mg/dL — AB (ref 65–99)
GLUCOSE-CAPILLARY: 106 mg/dL — AB (ref 65–99)
GLUCOSE-CAPILLARY: 111 mg/dL — AB (ref 65–99)
GLUCOSE-CAPILLARY: 97 mg/dL (ref 65–99)
Glucose-Capillary: 109 mg/dL — ABNORMAL HIGH (ref 65–99)
Glucose-Capillary: 128 mg/dL — ABNORMAL HIGH (ref 65–99)
Glucose-Capillary: 86 mg/dL (ref 65–99)

## 2016-07-27 MED ORDER — INSULIN ASPART 100 UNIT/ML ~~LOC~~ SOLN
0.0000 [IU] | Freq: Three times a day (TID) | SUBCUTANEOUS | Status: DC
  Administered 2016-07-27: 1 [IU] via SUBCUTANEOUS

## 2016-07-27 NOTE — Progress Notes (Signed)
10 Days Post-Op   Subjective: Feels "great". Frequent bowel movements but has slowed down some. Tolerating regular diet.  Objective: Vital signs in last 24 hours: Temp:  [98.2 F (36.8 C)-103 F (39.4 C)] 99 F (37.2 C) (12/25 0443) Pulse Rate:  [102-108] 108 (12/25 0443) Resp:  [18-20] 18 (12/25 0443) BP: (110-116)/(66-73) 116/66 (12/25 0443) SpO2:  [99 %-100 %] 99 % (12/25 0443) Last BM Date: 07/26/16  Intake/Output from previous day: 12/24 0701 - 12/25 0700 In: 740 [P.O.:740] Out: 220 [Urine:120; Drains:100] Intake/Output this shift: No intake/output data recorded.  General appearance: alert, cooperative and no distress Resp: No wheezing or increased work of breathing GI: normal findings: soft, non-tender Incision/Wound: VAC dressings in place without erythema or unusual drainage  Lab Results:   Recent Labs  07/25/16 0330 07/27/16 0443  WBC 15.0* 11.9*  HGB 10.5* 10.3*  HCT 33.0* 32.2*  PLT 214 260   BMET  Recent Labs  07/24/16 0946 07/25/16 0330  NA 140 140  K 3.5 3.5  CL 110 109  CO2 25 27  GLUCOSE 136* 129*  BUN 16 13  CREATININE 0.83 0.77  CALCIUM 7.8* 7.9*     Studies/Results: Dg Chest 2 View  Result Date: 07/27/2016 CLINICAL DATA:  Atelectasis. EXAM: CHEST  2 VIEW COMPARISON:  07/23/2016 FINDINGS: Right-sided PICC line unchanged. Lungs are hypoinflated with persistent elevation of the left hemidiaphragm with left basilar opacification likely atelectasis. Cardiomediastinal silhouette and remainder of the exam is unchanged. IMPRESSION: Stable elevated left hemidiaphragm with suggestion of left base opacification likely atelectasis. Right-sided PICC line unchanged. Electronically Signed   By: Elberta Fortisaniel  Boyle M.D.   On: 07/27/2016 08:10    Anti-infectives: Anti-infectives    Start     Dose/Rate Route Frequency Ordered Stop   07/22/16 1800  vancomycin (VANCOCIN) IVPB 1000 mg/200 mL premix  Status:  Discontinued     1,000 mg 200 mL/hr over 60  Minutes Intravenous Every 8 hours 07/22/16 0849 07/22/16 1104   07/22/16 0900  vancomycin (VANCOCIN) 2,000 mg in sodium chloride 0.9 % 500 mL IVPB  Status:  Discontinued     2,000 mg 250 mL/hr over 120 Minutes Intravenous  Once 07/22/16 0844 07/22/16 1104   07/21/16 1100  anidulafungin (ERAXIS) 100 mg in sodium chloride 0.9 % 100 mL IVPB     100 mg over 90 Minutes Intravenous Every 24 hours 07/20/16 1058 07/22/16 1230   07/20/16 1200  anidulafungin (ERAXIS) 200 mg in sodium chloride 0.9 % 200 mL IVPB     200 mg over 180 Minutes Intravenous  Once 07/20/16 1058 07/20/16 1508   07/13/16 0600  piperacillin-tazobactam (ZOSYN) IVPB 3.375 g     3.375 g 12.5 mL/hr over 240 Minutes Intravenous Every 8 hours 07/13/16 0225 07/22/16 2359   07/12/16 2300  piperacillin-tazobactam (ZOSYN) IVPB 3.375 g     3.375 g 100 mL/hr over 30 Minutes Intravenous  Once 07/12/16 2246 07/12/16 2309   07/12/16 2300  vancomycin (VANCOCIN) 2,000 mg in sodium chloride 0.9 % 500 mL IVPB     2,000 mg 250 mL/hr over 120 Minutes Intravenous  Once 07/12/16 2246 07/13/16 0101      Assessment/Plan: s/p Procedure(s): EXPLORATORY LAPAROTOMY, PRIMARY REPAIR OF RIGHT INGUINAL HERNIA , CLOSURE OF ABDOMEN Continues to improve. Leukocytosis resolving. Chest x-ray just consistent with atelectasis. Continue pulmonary toilet. Plan discharge tomorrow.   LOS: 14 days    Zohan Shiflet T 07/27/2016

## 2016-07-28 LAB — CBC
HEMATOCRIT: 31.1 % — AB (ref 39.0–52.0)
HEMOGLOBIN: 10.2 g/dL — AB (ref 13.0–17.0)
MCH: 28.9 pg (ref 26.0–34.0)
MCHC: 32.8 g/dL (ref 30.0–36.0)
MCV: 88.1 fL (ref 78.0–100.0)
Platelets: 324 10*3/uL (ref 150–400)
RBC: 3.53 MIL/uL — AB (ref 4.22–5.81)
RDW: 14.7 % (ref 11.5–15.5)
WBC: 10.1 10*3/uL (ref 4.0–10.5)

## 2016-07-28 LAB — COMPREHENSIVE METABOLIC PANEL
ALK PHOS: 85 U/L (ref 38–126)
ALT: 77 U/L — AB (ref 17–63)
AST: 64 U/L — AB (ref 15–41)
Albumin: 2.1 g/dL — ABNORMAL LOW (ref 3.5–5.0)
Anion gap: 11 (ref 5–15)
BILIRUBIN TOTAL: 0.5 mg/dL (ref 0.3–1.2)
BUN: 9 mg/dL (ref 6–20)
CALCIUM: 8 mg/dL — AB (ref 8.9–10.3)
CHLORIDE: 108 mmol/L (ref 101–111)
CO2: 20 mmol/L — ABNORMAL LOW (ref 22–32)
Creatinine, Ser: 0.76 mg/dL (ref 0.61–1.24)
GFR calc Af Amer: >60 mL/min (ref >60)
Glucose, Bld: 103 mg/dL — ABNORMAL HIGH (ref 65–99)
Potassium: 3.6 mmol/L (ref 3.5–5.1)
Sodium: 139 mmol/L (ref 135–145)
Total Protein: 6.3 g/dL — ABNORMAL LOW (ref 6.5–8.1)

## 2016-07-28 LAB — GLUCOSE, CAPILLARY
GLUCOSE-CAPILLARY: 114 mg/dL — AB (ref 65–99)
Glucose-Capillary: 108 mg/dL — ABNORMAL HIGH (ref 65–99)

## 2016-07-28 MED ORDER — ACETAMINOPHEN 325 MG PO TABS: 650.0000 mg | ORAL_TABLET | Freq: Four times a day (QID) | ORAL | Status: DC | PRN

## 2016-07-28 MED ORDER — OXYCODONE-ACETAMINOPHEN 5-325 MG PO TABS: 1.0000 | ORAL_TABLET | ORAL | 0 refills | Status: DC | PRN

## 2016-07-28 MED ORDER — OXYCODONE-ACETAMINOPHEN 5-325 MG PO TABS: 1.0000 | ORAL_TABLET | ORAL | Status: DC | PRN

## 2016-07-28 NOTE — Progress Notes (Addendum)
Pt is anxious to go home today. Prevena wound vac was applied to right groin incision today.  KCI Vac was delivered at 1700, connected to mid abd wound vac. Discharge instructions given to pt and spouse. Verbalized understanding. Discharged to home accompanied by spouse.

## 2016-07-28 NOTE — Discharge Summary (Signed)
Physician Discharge Summary  Patient ID: Travis Gould MRN: [id number] DOB/AGE: April 01, 1971 45 y.o.  Admit date: 07/12/2016 Discharge date: 07/28/2016  Admission Diagnoses:  Small bowel obstruction with lactic acidosis with recurrence of hernia Mild creatinine elevation Body mass index is 36.6  Family history of hypertension/AODM   Discharge Diagnoses:  Recurrent right inguinal hernia, strangulation of terminal ileum, bowel obstruction Open abdomen with worsening sepsis and bowel in discontinuity, with ischemic distal small bowel,  Septic shock from ischemic bowel Shock - requiring pressors till 07/19/16 VDRF 12/11- 07/18/16 E coli tracheobronchitis Acute metabolic encephalopathy Malnutrition Deconditioning  Active Problems:   Incarcerated hernia   Intestinal obstruction   Nausea and vomiting   Respiratory failure (HCC)   Right inguinal hernia   Tracheobronchitis   Septic shock (HCC)   Acute respiratory failure with hypoxia (HCC)   Hyperglycemia   PROCEDURES:  1.  Exploration of right groin, reduction of right inguinal hernia, resection of small bowel, placement of 200cm^2 negative pressure dressing, 07/13/16, Feliciana Rossetti, M.D.  2.  Open abdomen with worsening sepsis and bowel in discontinuity, with ischemic distal small bowel,   EXPLORATORY LAPAROTOMY, Small bowel anastomosis, Incidental appendectomy, 07/15/16, Dr. Jimmye Norman  3.  EXPLORATORY LAPAROTOMY, PRIMARY REPAIR OF RIGHT INGUINAL HERNIA , CLOSURE OF ABDOMEN, 07/17/16, Dr. Vergia Alberts Course:   45 yo male underwent right open inguinal hernia recently for bowel obstruction. He was discharge from the hospital and on 07/11/16, began having abdominal pain and nausea. He was sent home and then began having horrible diarrhea and came back to the ER. He now has nausea and vomiting and diffuse abdominal pain that is constant.  He was seen in the ED and had lactic acidosis.  He was admitted and Into the  operating room from the emergency department. Findings included recurrent inguinal hernia with strangulation of the terminal ileum and subsequent bowel obstruction. He underwent exploratory surgery reduction of the hernia removal of the mesh and resection of ischemic small bowel. He was left in discontinuity. He was transferred to the intensive care unit postoperatively. He was seen in consultation by critical care medicine and maintained on the ventilator, pressors for hypotension and antibiotics for sepsis. He required central line placement on 07/13/2016 for fluid resuscitation, and IV access. On the first postoperative day he was still on pressors and had a low urine output. He continued to be hypotensive and required pressor agents, he also developed fever. He returned to the operating room on 07/15/2016 by Dr. Jimmye Norman, and was found to have more ischemic bowel. He underwent further small bowel resection with anastomosis and incidental appendectomy. He was left in discontinuity and returned to the ICU.  He was taken back to the operating room on 07/17/16 at this time the bowel was found to be viable. At that time he underwent primary repair of his right inguinal hernia and closure of the abdomen by Dr. Lindie Spruce. He developed a metabolic encephalopathy and required Haldol, he was maintained on Precedex and fentanyl drips. Started developing some serous drainage from his right inguinal incision on 07/18/16. He self extubated in the evening of 07/18/16 and was left off the ventilator with good oxygen saturations on just supplemental oxygen. He was weaned off pressors and the evening of 07/18/16 also. To note his bowel rest started him on some ice chips at that point. He had enough serous drainage that Dr. Corliss Skains suggested an [facility] or wound nurse consult. Open abdominal wound looked good. On 07/20/16 a  Prevena incisional wound vac and negative pressure wound vac was placed on the open abdominal wound. From  this point he made slow but continued progress. Postoperatively he was placed on TNA for nutritional support secondary to his malnutrition. Critical care medicine eventually backed out of the patient's care. As his GI function returned his diet was increased. He was weaned off antibiotics on 07/22/2016. His white count remained elevated and a repeat CT scan was performed on 07/24/16. This showed new collapse and consolidation of the left lower lobe with a small left pleural effusion and minimal atelectasis on the right lower lobe. He had some persistent dilatation of the mid and distal small bowel in the region of the ileocolonic anastomosis. Otherwise it looked good. There was some associated mesenteric edema. By the a.m. of 07/28/16 he was on a regular diet, he was tolerating this well. He was having some loose stools but reported they were yellow. He was seen by case management and we have applied for a home health nurse to attend to his negative pressure wound VAC. A new Prevrna system was placed on the right lower quadrant incisional hernia site. Staples were in place still and this looked good. This will come off in 7 days with the home health nurse follow-up. His abdominal wound was changed early this a.m. I did not see but it was reported looking well. He is tolerating a regular diet and mobilizing without assistance. He was evaluated by physical therapy and no PT for follow-up for supervision was required. It was recommended he have supervision and assistance 24 /7.  Today he is afebrile, he still has some mild tachycardia, but is up and ambulating without difficulty. I've seen him in and out of the bathroom several times already today. He requires no assistance. We rechecked his labs this a.m. with this shows his white count is down to 10.1. Hemoglobin and hematocrit are stable. Platelets are normal at 324,000. His glucose is same as 103, off TNA. His last prealbumin on 07/20/16 was 14.7. CBC and CMP  are below. We are currently awaiting approval [facility] his wound [facility]. Now waiting on delivery of the pump. Once this is approval will plan discharge home. He reports his family including his mother and his wife will be at home to assist him.  He has follow-up with Dr. Janee Mornhompson on 08/06/16  Condition ON discharge: Improving    CBC Latest Ref Rng & Units 07/28/2016 07/27/2016 07/25/2016  WBC 4.0 - 10.5 K/uL 10.1 11.9(H) 15.0(H)  Hemoglobin 13.0 - 17.0 g/dL 10.2(L) 10.3(L) 10.5(L)  Hematocrit 39.0 - 52.0 % 31.1(L) 32.2(L) 33.0(L)  Platelets 150 - 400 K/uL 324 260 214   CMP Latest Ref Rng & Units 07/28/2016 07/25/2016 07/24/2016  Glucose 65 - 99 mg/dL 409(W103(H) 119(J129(H) 478(G136(H)  BUN 6 - 20 mg/dL 9 13 16   Creatinine 0.61 - 1.24 mg/dL 9.560.76 2.130.77 0.860.83  Sodium 135 - 145 mmol/L 139 140 140  Potassium 3.5 - 5.1 mmol/L 3.6 3.5 3.5  Chloride 101 - 111 mmol/L 108 109 110  CO2 22 - 32 mmol/L 20(L) 27 25  Calcium 8.9 - 10.3 mg/dL 8.0(L) 7.9(L) 7.8(L)  Total Protein 6.5 - 8.1 g/dL 6.3(L) - -  Total Bilirubin 0.3 - 1.2 mg/dL 0.5 - -  Alkaline Phos 38 - 126 U/L 85 - -  AST 15 - 41 U/L 64(H) - -  ALT 17 - 63 U/L 77(H) - -   Plan: Home later today after home health  arrangements are completed.   Disposition: 01-Home or Self Care   Allergies as of 07/28/2016   No Known Allergies     Medication List    STOP taking these medications   docusate sodium 100 MG capsule Commonly known as:  COLACE   ibuprofen 200 MG tablet Commonly known as:  ADVIL,MOTRIN   ondansetron 4 MG tablet Commonly known as:  ZOFRAN     TAKE these medications   acetaminophen 325 MG tablet Commonly known as:  TYLENOL Take 2 tablets (650 mg total) by mouth every 6 (six) hours as needed for mild pain, moderate pain or headache.   oxyCODONE-acetaminophen 5-325 MG tablet Commonly known as:  PERCOCET/ROXICET Take 1-2 tablets by mouth every 4 (four) hours as needed for moderate pain. What changed:  reasons to  take this      Follow-up Information    Liz MaladyHOMPSON,BURKE E, MD Follow up on 08/06/2016.   Specialty:  General Surgery Why:  Your appointment is for 2:40 PM, be a the office 30 minutes early for check in.   Contact information: [address] [place] [address] [phone]        Primary care provider Follow up.   Contact information: you need to obtain and follow up with a primary care provider, Call your insurance for a provider recommendation.       [facility] Follow up.   Why:  You can call here and see if they will take you, or if they can provide recommendations also.  Contact information: [address] [place] [phone]          Signed: Sherrie GeorgeJENNINGS,Alonni Heimsoth 07/28/2016, 1:52 PM

## 2016-07-28 NOTE — Progress Notes (Signed)
11 Days Post-Op  Subjective: He looks good, stool loose, he didn't us IS yesterday.  Tolerating diet, but not allot of appetite so far.   Wound vac changed this AM before we came in.    Objective: Vital signs in last 24 hours: Temp:  [98.6 F (37 C)-98.7 F (37.1 C)] 98.6 F (37 C) (12/26 0501) Pulse Rate:  [106-109] 106 (12/26 0501) Resp:  [19-20] 20 (12/26 0501) BP: (94-112)/(48-69) 94/48 (12/26 0501) SpO2:  [97 %-100 %] 98 % (12/26 0501) Last BM Date: 07/27/16 780 Po Urine x 2 recorded Drain - 50 Stool x 4 Afebrile, last fever 07/26/16 =>>  103 No fever since that time Remains tachycardic 100-110  range Last WBC 07/26/16 down to 11.9 CXR 07/27/16:  Right-sided PICC line unchanged. Lungs are hypoinflated with persistent elevation of the left hemidiaphragm with left basilar opacification likely atelectasis. Cardiomediastinal silhouette and remainder of the exam is unchanged. Intake/Output from previous day: 12/25 0701 - 12/26 0700 In: 780 [P.O.:780] Out: 50 [Drains:50] Intake/Output this shift: No intake/output data recorded.  General appearance: alert, cooperative and no distress Resp: clear to auscultation bilaterally and BS down in the left base GI: soft, non-tender; bowel sounds normal; no masses,  no organomegaly and open wounds midline and right groin with wound vac in place.  Pt reports loose yellow stools. I looked at the right groin and it looks good, being replaced with a new system and HHN will remove in 7 days.  The wound vac will be changed by home health MWF, and that is being arranged.   Lab Results:   Recent Labs  07/27/16 0443  WBC 11.9*  HGB 10.3*  HCT 32.2*  PLT 260    BMET No results for input(s): NA, K, CL, CO2, GLUCOSE, BUN, CREATININE, CALCIUM in the last 72 hours. PT/INR No results for input(s): LABPROT, INR in the last 72 hours.   Recent Labs Lab 07/23/16 0447  AST 127*  ALT 107*  ALKPHOS 50  BILITOT 0.8  PROT 5.4*  ALBUMIN 2.1*      Lipase     Component Value Date/Time   LIPASE 18 07/12/2016 1840     Studies/Results: Dg Chest 2 View  Result Date: 07/27/2016 CLINICAL DATA:  Atelectasis. EXAM: CHEST  2 VIEW COMPARISON:  07/23/2016 FINDINGS: Right-sided PICC line unchanged. Lungs are hypoinflated with persistent elevation of the left hemidiaphragm with left basilar opacification likely atelectasis. Cardiomediastinal silhouette and remainder of the exam is unchanged. IMPRESSION: Stable elevated left hemidiaphragm with suggestion of left base opacification likely atelectasis. Right-sided PICC line unchanged. Electronically Signed   By: Elberta Fortisaniel  Boyle M.D.   On: 07/27/2016 08:10    Medications: . heparin  5,000 Units Subcutaneous Q8H  . insulin aspart  0-9 Units Subcutaneous TID AC & HS  . sodium chloride flush  10-40 mL Intracatheter Q12H    Assessment/Plan Right inguinal hernia with Mesh repair 07/07/16, Dr. Violeta GelinasBurke Thompson- Discharged 07/08/16 Readmitted on 07/13/16:  Current right inguinal hernia, strangulation of terminal ileum, bowel obstruction 1.  S/p exploration of right groin, reduction of right inguinal hernia, resection of small bowel, placement of 200cm^2 negative pressure dressing, 07/13/16, Feliciana RossettiLuke Kinsinger, M.D. 2.  Open abdomen with worsening sepsis and bowel in discontinuity, with ischemic distal small bowel,  S/p EXPLORATORY LAPAROTOMY, Small bowel anastomosis, Incidental appendectomy, 07/15/16, Dr. Jimmye NormanJames Wyatt 3.  EXPLORATORY LAPAROTOMY, PRIMARY REPAIR OF RIGHT INGUINAL HERNIA , CLOSURE OF ABDOMEN, 07/17/16, Dr. Jimmye NormanJames Wyatt Septic shock from ischemic bowel Shock - requiring pressors  till 07/19/16 VDRF 12/11- 07/18/16 E coli tracheobronchitis Acute metabolic encephalopathy ID:  Antibiotics stopped 07/22/16 FEN: TNA 07/20/16 - discontinued 07/26/16- Clear liquids started 07/25/16 =>>  Regular diet 07/26/16 DVT:  Heparin/SCD   Plan:  Recheck labs, set up home health and wound vac care.  Set up to  follow up with Dr. Janee Mornhompson, and if labs OK pull PICC line later today before discharge. Plan discharge after we get the Wound vac approved by insurance.   CBC Latest Ref Rng & Units 07/28/2016 07/27/2016 07/25/2016  WBC 4.0 - 10.5 K/uL 10.1 11.9(H) 15.0(H)  Hemoglobin 13.0 - 17.0 g/dL 10.2(L) 10.3(L) 10.5(L)  Hematocrit 39.0 - 52.0 % 31.1(L) 32.2(L) 33.0(L)  Platelets 150 - 400 K/uL 324 260 214   CMP Latest Ref Rng & Units 07/28/2016 07/25/2016 07/24/2016  Glucose 65 - 99 mg/dL 161(W103(H) 960(A129(H) 540(J136(H)  BUN 6 - 20 mg/dL 9 13 16   Creatinine 0.61 - 1.24 mg/dL 8.110.76 9.140.77 7.820.83  Sodium 135 - 145 mmol/L 139 140 140  Potassium 3.5 - 5.1 mmol/L 3.6 3.5 3.5  Chloride 101 - 111 mmol/L 108 109 110  CO2 22 - 32 mmol/L 20(L) 27 25  Calcium 8.9 - 10.3 mg/dL 8.0(L) 7.9(L) 7.8(L)  Total Protein 6.5 - 8.1 g/dL 6.3(L) - -  Total Bilirubin 0.3 - 1.2 mg/dL 0.5 - -  Alkaline Phos 38 - 126 U/L 85 - -  AST 15 - 41 U/L 64(H) - -  ALT 17 - 63 U/L 77(H) - -      LOS: 15 days    Wileen Duncanson 07/28/2016 [phone]

## 2016-07-28 NOTE — Care Management Note (Signed)
Case Management Note  Patient Details  Name: Travis PerlShane Q Lichty MRN: [id number] Date of Birth: 04/25/1971  Subjective/Objective:                    Action/Plan: [facility] VAC for abdomen will be delivered to patient's room today . Dianna with [employer] unable to give an estimate time of delivery.  Expected Discharge Date:                  Expected Discharge Plan:  Home w Home Health Services  In-House Referral:     Discharge planning Services  CM Consult  Post Acute Care Choice:  Durable Medical Equipment Choice offered to:  Patient  DME Arranged:  Vac DME Agency:  [facility]  HH Arranged:  RN HH Agency:  [facility]  Status of Service:  Completed, signed off  If discussed at Long Length of Stay Meetings, dates discussed:    Additional Comments:  Kingsley PlanWile, Sacha Topor Marie, RN 07/28/2016, 2:10 PM

## 2016-07-28 NOTE — Progress Notes (Signed)
Pt BP 94/48 this AM. Pt asymptomatic. Other VSS. Notified Dr. Lindie SpruceWyatt. No new order at this time. Will continue to monitor pt.

## 2016-07-28 NOTE — Discharge Instructions (Signed)
[facility]      [facility] [place] [phone]  OPEN ABDOMINAL SURGERY: POST OP INSTRUCTIONS Home health nurse will see you and help with dressing change. Always review your discharge instruction sheet given to you by the facility where your surgery was performed.  IF YOU HAVE DISABILITY OR FAMILY LEAVE FORMS, YOU MUST BRING THEM TO THE OFFICE FOR PROCESSING.  PLEASE DO NOT GIVE THEM TO YOUR DOCTOR.  1. A prescription for pain medication may be given to you upon discharge.  Take your pain medication as prescribed, if needed.  If narcotic pain medicine is not needed, then you may take acetaminophen (Tylenol) or ibuprofen (Advil) as needed. 2. Take your usually prescribed medications unless otherwise directed. 3. If you need a refill on your pain medication, please contact your pharmacy. They will contact our office to request authorization.  Prescriptions will not be filled after 5pm or on week-ends. 4. You should follow a light diet the first few days after arrival home, such as soup and crackers, pudding, etc.unless your doctor has advised otherwise. A high-fiber, low fat diet can be resumed as tolerated.   Be sure to include lots of fluids daily. Most patients will experience some swelling and bruising on the chest and neck area.  Ice packs will help.  Swelling and bruising can take several days to resolve 5. Most patients will experience some swelling and bruising in the area of the incision. Ice pack will help. Swelling and bruising can take several days to resolve..  6. It is common to experience some constipation if taking pain medication after surgery.  Increasing fluid intake and taking a stool softener will usually help or prevent this problem from occurring.  A mild laxative (Milk of Magnesia or Miralax) should be taken according to package directions if there are no bowel movements after 48 hours. 7.  You may have steri-strips (small skin tapes) in place directly over the incision.   These strips should be left on the skin for 7-10 days.  If your surgeon used skin glue on the incision, you may shower in 24 hours.  The glue will flake off over the next 2-3 weeks.  Any sutures or staples will be removed at the office during your follow-up visit. You may find that a light gauze bandage over your incision may keep your staples from being rubbed or pulled. You may shower and replace the bandage daily. 8. ACTIVITIES:  You may resume regular (light) daily activities beginning the next day--such as daily self-care, walking, climbing stairs--gradually increasing activities as tolerated.  You may have sexual intercourse when it is comfortable.  Refrain from any heavy lifting or straining until approved by your doctor. a. You may drive when you no longer are taking prescription pain medication, you can comfortably wear a seatbelt, and you can safely maneuver your car and apply brakes b. Return to Work: ___________________________________ 9. You should see your doctor in the office for a follow-up appointment approximately two weeks after your surgery.  Make sure that you call for this appointment within a day or two after you arrive home to insure a convenient appointment time. OTHER INSTRUCTIONS:  _____________________________________________________________ _____________________________________________________________  WHEN TO CALL YOUR DOCTOR: 1. Fever over 101.0 2. Inability to urinate 3. Nausea and/or vomiting 4. Extreme swelling or bruising 5. Continued bleeding from incision. 6. Increased pain, redness, or drainage from the incision. 7. Difficulty swallowing or breathing 8. Muscle cramping or spasms. 9. Numbness or tingling in hands or feet  or around lips.  The clinic staff is available to answer your questions during regular business hours.  Please dont hesitate to call and ask to speak to one of the nurses if you have concerns.  For further questions, please visit  [email or web address]  Vacuum-Assisted Closure Therapy Vacuum-assisted closure (VAC) therapy uses a device that removes fluid and germs from wounds to help them heal. It is used on wounds that cannot be closed with stitches. They often heal slowly. Vacuum-assisted therapy helps the wound stay clean and healthy while the open wound slowly grows back together. Vacuum-assisted closure therapy uses a bandage (dressing) that is made of foam. It is put inside the wound. Then, a drape is placed over the wound. This drape sticks to your skin to keep air out, and to protect the wound. A tube is hooked up to a small pump and is attached to the drape. The pump sucks out the fluid and germs. Vacuum-assisted closure therapy can also help reduce the bad smell that comes from the wound. HOW DOES IT WORK?  The vacuum pump pulls fluid through the foam dressing. The dressing may wrinkle during this process. The fluid goes into the tube and away from the wound. The fluid then goes into a container. The fluid in the container must be replaced if it is full or at least once a week, even if the container is not full. The pulling from the pump helps to close the wound and bring better circulation to the wound area. The foam dressing covers and protects the wound. It helps your wound heal faster.  HOW DOES IT FEEL?   You might feel a little pulling when the pump is on.  You might also feel a mild vibrating sensation.  You might feel some discomfort when the dressing is taken off. CAN I MOVE AROUND WITH VACUUM-ASSISTED CLOSURE THERAPY? Yes, it has a backup battery which is used when the machine is not plugged in, as long as the battery is working, you can move freely. WHAT ARE SOME THINGS I MUST KNOW?  Do not turn off the pump yourself, unless instructed to do so by your healthcare provider, such as for bathing.  Do not take off the dressing yourself, unless instructed to do so by your caregiver.  You can  wash or shower with the dressing. However, do not take the pump into the shower. Make sure the wound dressing is protected and covered with plastic. The wound area must stay dry.  Do not turn off the pump for more than 2 hours. If the pump is off for more than 2 hours, your nurse must change your dressing.  Check frequently that the machine is on, that the machine indicates the therapy is on, and that all clamps are open. THE ALARM IS SOUNDING! WHAT SHOULD I DO?   Stay calm.  Do not turn off the pump or do anything with the dressing.  Call your clinic or caregiver right away if the alarm goes off and you cannot fix the problem. Some reasons the alarm might go off include:  The fluid collection container is full.  The battery is low.  The dressing has a leak.  Explain to your caregiver what is happening. Follow the instructions you receive. WHEN SHOULD I CALL FOR HELP?   You have severe pain.  You have difficulty breathing.  You have bleeding that will not stop.  Your wound smells bad.  You have redness, swelling, or fluid leaking from your  wound.  Your alarm goes off and you do not know what to do.  You have a fever.  Your wound itches severely.  Your dressing changes are often painful or bleeding often occurs.  You have diarrhea.  You have a sore throat.  You have a rash around the dressing or anywhere else on your body.  You feel nauseous.  You feel dizzy or weak.  The [facility] machine has been off for more than 2 hours. HOW DO I GET READY TO GO HOME WITH A PUMP?  A trained caregiver will talk to you and answer your questions about your vacuum-assisted closure therapy before you go home. He or she will explain what to expect. A caregiver will come to your home to apply the pump and care for your wound. The at-home caregiver will be available for questions and will come back for the scheduled dressing changes, usually every 48-72 hours (or more often for severely  infected wounds). Your at-home caregiver will also come if you are having an unexpected problem. If you have questions or do not know what to do when you go home, talk to your healthcare provider. This information is not intended to replace advice given to you by your health care provider. Make sure you discuss any questions you have with your health care provider. Document Released: 07/02/2008 Document Revised: 03/22/2013 Document Reviewed: 04/25/2015 Elsevier Interactive Patient Education  2017 [employer]

## 2016-07-28 NOTE — Progress Notes (Signed)
PT Cancellation Note  Patient Details Name: Travis Gould MRN: [id number] DOB: 04/14/1971   Cancelled Treatment:    Reason Eval/Treat Not Completed: Patient declined, no reason specified;Other (comment) (Per RN, pt going home today and is moving independently.)   Colin BroachSabra M. Deondrick Searls PT, DPT  [phone]  07/28/2016, 12:21 PM

## 2018-08-22 IMAGING — CR DG CHEST 1V PORT
1 series · 1 of 1 positions shown · non-contrast
Comparison: None.

CLINICAL DATA: Right-sided chest and abdominal pain

EXAM:
PORTABLE CHEST 1 VIEW

[AP]
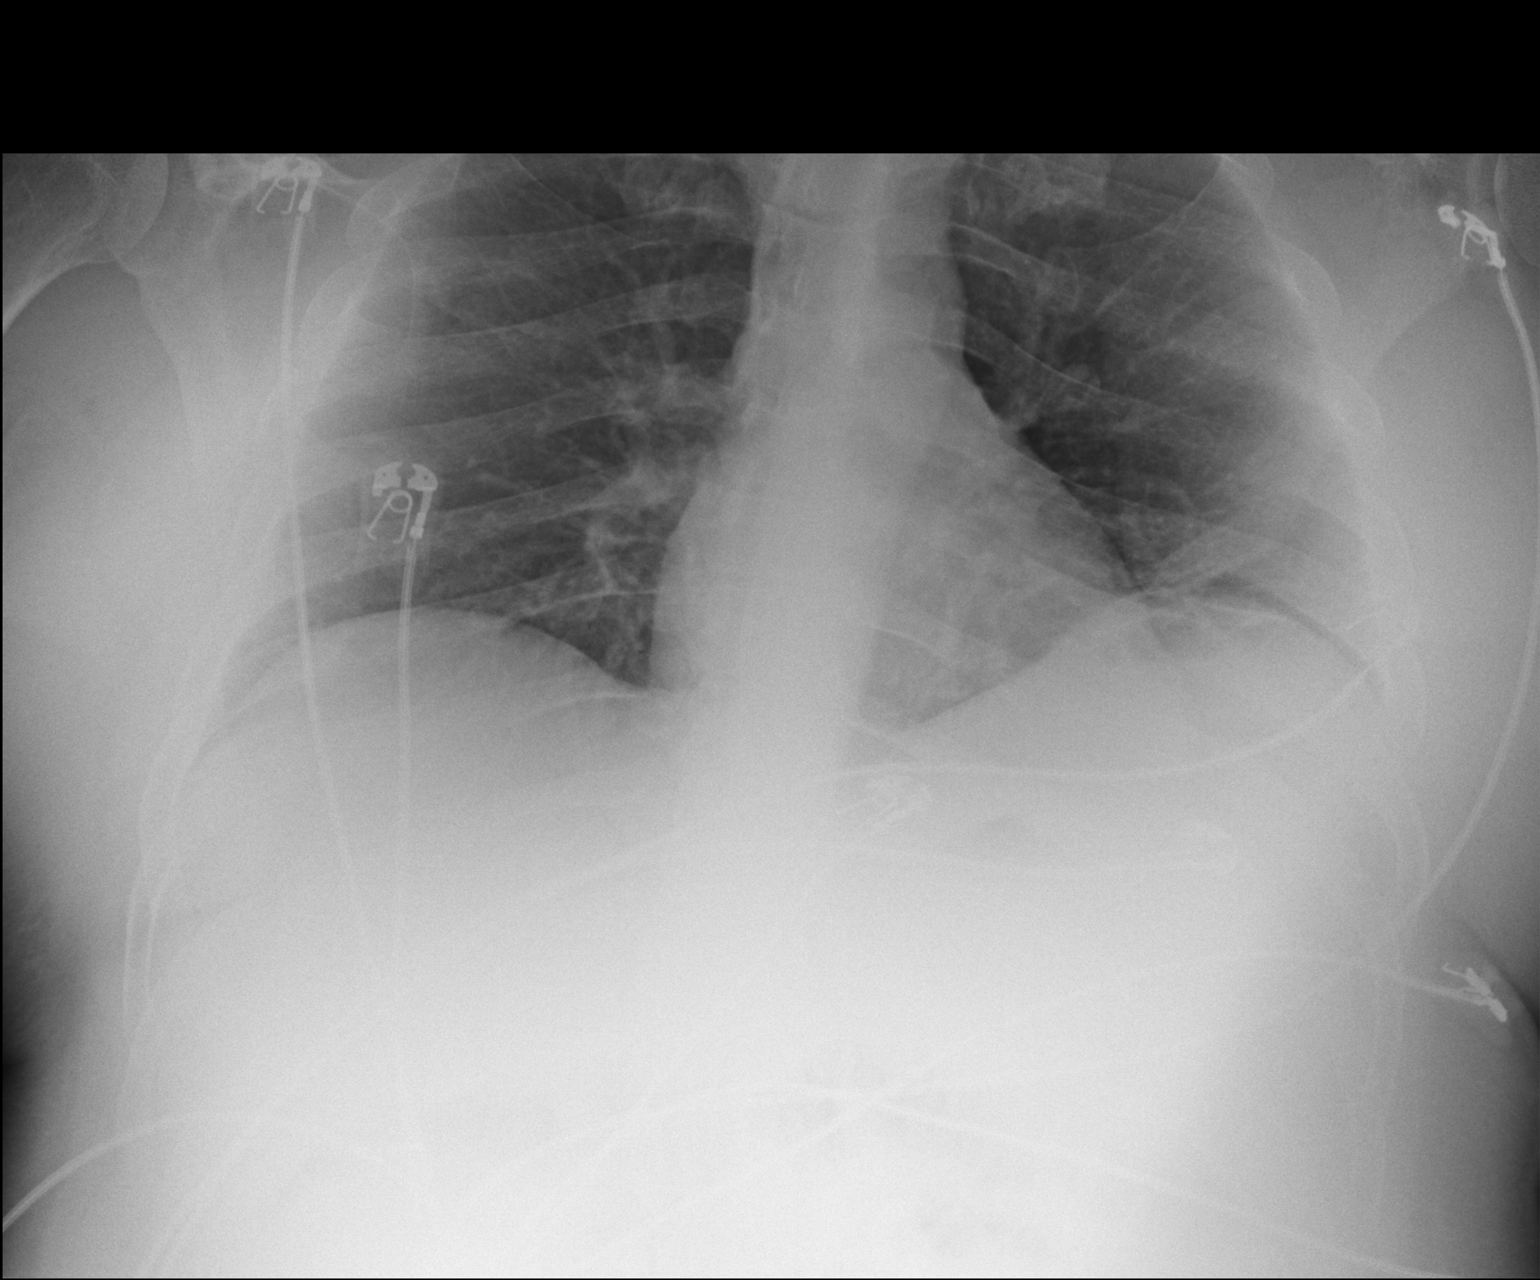

[1 of 1 positions shown; findings below may reference images not displayed]

FINDINGS: Cardiac shadow is within normal limits. The lungs are well aerated
with mild left basilar atelectasis. No focal confluent infiltrate is
seen. No effusion or pneumothorax is noted. No bony abnormality is
seen.
IMPRESSION: Minimal left basilar atelectasis.

## 2018-08-25 IMAGING — CR DG CHEST 1V PORT
1 series · 1 of 1 positions shown · non-contrast
Comparison: portable chest x-ray of 07/14/2016.

CLINICAL DATA: Recent abdominal surgery, endotracheal tube position

EXAM:
PORTABLE CHEST 1 VIEW

[AP]
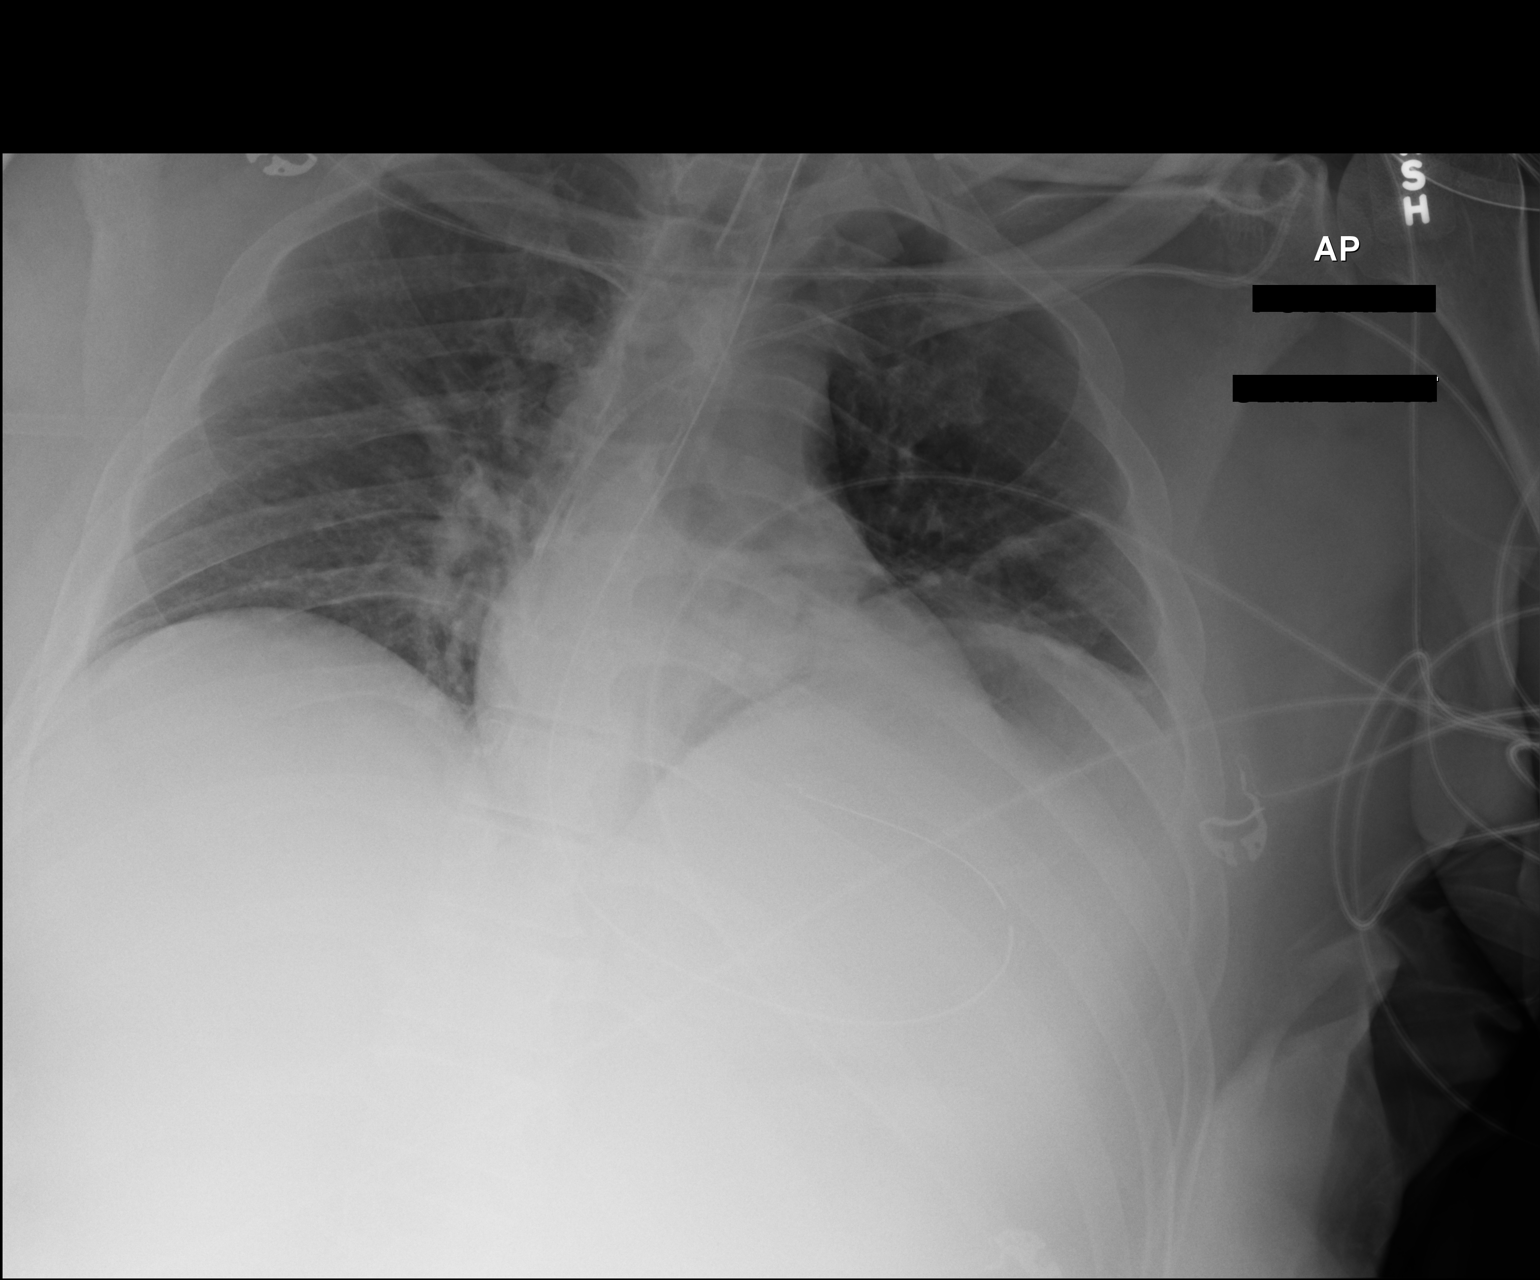

[1 of 1 positions shown; findings below may reference images not displayed]

FINDINGS: The tip of the endotracheal tube is approximately 5.4 cm above the
carina. Left basilar linear atelectasis remains. No pneumonia or
effusion is seen. Left central venous line is unchanged in position
with the tip overlying the lower SVC near the expected right atrial
junction.
IMPRESSION: 1. Tip of endotracheal tube 5.4 cm above the carina.
2. No change in left basilar linear atelectasis.

## 2023-12-03 ENCOUNTER — Emergency Department (HOSPITAL_COMMUNITY)

## 2023-12-03 ENCOUNTER — Emergency Department (HOSPITAL_COMMUNITY)
Admission: EM | Admit: 2023-12-03 | Discharge: 2023-12-03 | Disposition: A | Discharge status: Home / Self Care | Attending: Student | Admitting: Student

## 2023-12-03 DIAGNOSIS — S199XXA Unspecified injury of neck, initial encounter: Secondary | ICD-10-CM | POA: Diagnosis present

## 2023-12-03 DIAGNOSIS — S161XXA Strain of muscle, fascia and tendon at neck level, initial encounter: Secondary | ICD-10-CM | POA: Diagnosis not present

## 2023-12-03 DIAGNOSIS — R519 Headache, unspecified: Secondary | ICD-10-CM | POA: Diagnosis not present

## 2023-12-03 DIAGNOSIS — Y92410 Unspecified street and highway as the place of occurrence of the external cause: Secondary | ICD-10-CM | POA: Diagnosis not present

## 2023-12-03 MED ORDER — LIDOCAINE 5 % EX PTCH: 1.0000 | MEDICATED_PATCH | CUTANEOUS | 0 refills | Status: DC

## 2023-12-03 MED ORDER — OXYCODONE-ACETAMINOPHEN 5-325 MG PO TABS
1.0000 | ORAL_TABLET | Freq: Once | ORAL | Status: AC
  Administered 2023-12-03: 1 via ORAL
  Filled 2023-12-03: qty 1

## 2023-12-03 MED ORDER — LIDOCAINE 5 % EX PTCH
1.0000 | MEDICATED_PATCH | CUTANEOUS | Status: DC
  Administered 2023-12-03: 1 via TRANSDERMAL
  Filled 2023-12-03: qty 1

## 2023-12-03 MED ORDER — CYCLOBENZAPRINE HCL 10 MG PO TABS: 10.0000 mg | ORAL_TABLET | Freq: Two times a day (BID) | ORAL | 0 refills | Status: DC | PRN
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
## 2023-12-03 NOTE — ED Notes (Signed)
 Patient verbalized understanding of discharge instructions, patient verbalized he will not drive while he is on muscle relaxants. Patient does not have any other concerns at this time.

## 2024-02-18 ENCOUNTER — Other Ambulatory Visit: Payer: Self-pay | Admitting: General Surgery

## 2024-02-18 DIAGNOSIS — K4091 Unilateral inguinal hernia, without obstruction or gangrene, recurrent: Secondary | ICD-10-CM

## 2024-02-24 ENCOUNTER — Encounter: Payer: Self-pay | Admitting: General Surgery

## 2024-02-28 ENCOUNTER — Ambulatory Visit
Admission: RE | Admit: 2024-02-28 | Discharge: 2024-02-28 | Disposition: A | Discharge status: Home / Self Care | Source: Ambulatory Visit | Attending: General Surgery | Admitting: General Surgery

## 2024-02-28 ENCOUNTER — Encounter: Payer: Self-pay | Admitting: Radiology

## 2024-02-28 DIAGNOSIS — K4091 Unilateral inguinal hernia, without obstruction or gangrene, recurrent: Secondary | ICD-10-CM

## 2024-02-28 MED ORDER — IOPAMIDOL (ISOVUE-300) INJECTION 61%
100.0000 mL | Freq: Once | INTRAVENOUS | Status: AC | PRN
  Administered 2024-02-28: 100 mL via INTRAVENOUS

## 2024-03-15 ENCOUNTER — Ambulatory Visit: Payer: Self-pay | Admitting: General Surgery
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
## 2024-03-24 NOTE — Patient Instructions (Signed)
 SURGICAL WAITING ROOM VISITATION  Patients having surgery or a procedure may have no more than 2 support people in the waiting area - these visitors may rotate.    Children under the age of 67 must have an adult with them who is not the patient.  Visitors with respiratory illnesses are discouraged from visiting and should remain at home.  If the patient needs to stay at the hospital during part of their recovery, the visitor guidelines for inpatient rooms apply. Pre-op nurse will coordinate an appropriate time for 1 support person to accompany patient in pre-op.  This support person may not rotate.    Please refer to the [facility] website for the visitor guidelines for Inpatients (after your surgery is over and you are in a regular room).       Your procedure is scheduled on:  04/13/2024    Report to [facility] Main Entrance    Report to admitting at  0515 AM   Call this number if you have problems the morning of surgery [phone]   Do not eat food :After Midnight.   After Midnight you may have the following liquids until _ 0430_____ AM DAY OF SURGERY  Water Non-Citrus Juices (without pulp, NO RED-Apple, White grape, White cranberry) Black Coffee (NO MILK/CREAM OR CREAMERS, sugar ok)  Clear Tea (NO MILK/CREAM OR CREAMERS, sugar ok) regular and decaf                             Plain Jell-O (NO RED)                                           Fruit ices (not with fruit pulp, NO RED)                                     Popsicles (NO RED)                                                               Sports drinks like Gatorade (NO RED)                   The day of surgery:  Drink ONE (1) Pre-Surgery Clear Ensure or G2 at  0430AM the morning of surgery. Drink in one sitting. Do not sip.  This drink was given to you during your hospital  pre-op appointment visit. Nothing else to drink after completing the  Pre-Surgery Clear Ensure or G2.          If you have  questions, please contact your surgeon's office.       Oral Hygiene is also important to reduce your risk of infection.                                    Remember - BRUSH YOUR TEETH THE MORNING OF SURGERY WITH YOUR REGULAR TOOTHPASTE  DENTURES WILL BE REMOVED PRIOR TO SURGERY PLEASE DO NOT APPLY Poly grip OR ADHESIVES!!!  Do NOT smoke after Midnight   Stop all vitamins and herbal supplements 7 days before surgery.   Take these medicines the morning of surgery with A SIP OF WATER:  none   DO NOT TAKE ANY ORAL DIABETIC MEDICATIONS DAY OF YOUR SURGERY  Bring CPAP mask and tubing day of surgery.                              You may not have any metal on your body including hair pins, jewelry, and body piercing             Do not wear make-up, lotions, powders, perfumes/cologne, or deodorant  Do not wear nail polish including gel and S&S, artificial/acrylic nails, or any other type of covering on natural nails including finger and toenails. If you have artificial nails, gel coating, etc. that needs to be removed by a nail salon please have this removed prior to surgery or surgery may need to be canceled/ delayed if the surgeon/ anesthesia feels like they are unable to be safely monitored.   Do not shave  48 hours prior to surgery.               Men may shave face and neck.   Do not bring valuables to the hospital. [facility] IS NOT             RESPONSIBLE   FOR VALUABLES.   Contacts, glasses, dentures or bridgework may not be worn into surgery.   Bring small overnight bag day of surgery.   DO NOT BRING YOUR HOME MEDICATIONS TO THE HOSPITAL. PHARMACY WILL DISPENSE MEDICATIONS LISTED ON YOUR MEDICATION LIST TO YOU DURING YOUR ADMISSION IN THE HOSPITAL!    Patients discharged on the day of surgery will not be allowed to drive home.  Someone NEEDS to stay with you for the first 24 hours after anesthesia.   Special Instructions: Bring a copy of your healthcare power of attorney  and living will documents the day of surgery if you haven't scanned them before.              Please read over the following fact sheets you were given: IF YOU HAVE QUESTIONS ABOUT YOUR PRE-OP INSTRUCTIONS PLEASE CALL [phone].   If you received a COVID test during your pre-op visit  it is requested that you wear a mask when out in public, stay away from anyone that may not be feeling well and notify your surgeon if you develop symptoms. If you test positive for Covid or have been in contact with anyone that has tested positive in the last 10 days please notify you surgeon.    [place] - Preparing for Surgery Before surgery, you can play an important role.  Because skin is not sterile, your skin needs to be as free of germs as possible.  You can reduce the number of germs on your skin by washing with CHG (chlorahexidine gluconate) soap before surgery.  CHG is an antiseptic cleaner which kills germs and bonds with the skin to continue killing germs even after washing. Please DO NOT use if you have an allergy to CHG or antibacterial soaps.  If your skin becomes reddened/irritated stop using the CHG and inform your nurse when you arrive at Short Stay. Do not shave (including legs and underarms) for at least 48 hours prior to the first CHG shower.  You may shave your face/neck. Please follow these  instructions carefully:  1.  Shower with CHG Soap the night before surgery and the  morning of Surgery.  2.  If you choose to wash your hair, wash your hair first as usual with your  normal  shampoo.  3.  After you shampoo, rinse your hair and body thoroughly to remove the  shampoo.                           4.  Use CHG as you would any other liquid soap.  You can apply chg directly  to the skin and wash                       Gently with a scrungie or clean washcloth.  5.  Apply the CHG Soap to your body ONLY FROM THE NECK DOWN.   Do not use on face/ open                           Wound or open sores. Avoid  contact with eyes, ears mouth and genitals (private parts).                       Wash face,  Genitals (private parts) with your normal soap.             6.  Wash thoroughly, paying special attention to the area where your surgery  will be performed.  7.  Thoroughly rinse your body with warm water from the neck down.  8.  DO NOT shower/wash with your normal soap after using and rinsing off  the CHG Soap.                9.  Pat yourself dry with a clean towel.            10.  Wear clean pajamas.            11.  Place clean sheets on your bed the night of your first shower and do not  sleep with pets. Day of Surgery : Do not apply any lotions/deodorants the morning of surgery.  Please wear clean clothes to the hospital/surgery center.  FAILURE TO FOLLOW THESE INSTRUCTIONS MAY RESULT IN THE CANCELLATION OF YOUR SURGERY PATIENT SIGNATURE_________________________________  NURSE SIGNATURE__________________________________  ________________________________________________________________________

## 2024-03-28 ENCOUNTER — Encounter (HOSPITAL_COMMUNITY)
Admission: RE | Admit: 2024-03-28 | Discharge: 2024-03-28 | Disposition: A | Discharge status: Home / Self Care | Source: Ambulatory Visit | Attending: Anesthesiology | Admitting: Anesthesiology

## 2024-03-28 ENCOUNTER — Encounter (HOSPITAL_COMMUNITY): Payer: Self-pay

## 2024-03-28 ENCOUNTER — Other Ambulatory Visit: Payer: Self-pay

## 2024-03-28 VITALS — Ht 68.0 in | Wt 260.0 lb

## 2024-03-28 DIAGNOSIS — Z01818 Encounter for other preprocedural examination: Secondary | ICD-10-CM

## 2024-04-12 NOTE — Anesthesia Preprocedure Evaluation (Signed)
 Anesthesia Evaluation  Patient identified by MRN, date of birth, ID band Patient awake    Reviewed: Allergy & Precautions, H&P , NPO status , Patient's Chart, lab work & pertinent test results  Airway Mallampati: III  TM Distance: >3 FB Neck ROM: Full    Dental no notable dental hx. (+) Loose, Dental Advisory Given   Pulmonary neg pulmonary ROS   Pulmonary exam normal breath sounds clear to auscultation       Cardiovascular Exercise Tolerance: Good negative cardio ROS  Rhythm:Regular Rate:Normal     Neuro/Psych negative neurological ROS  negative psych ROS   GI/Hepatic negative GI ROS, Neg liver ROS,,,  Endo/Other    Class 3 obesity  Renal/GU negative Renal ROS  negative genitourinary   Musculoskeletal   Abdominal   Peds  Hematology negative hematology ROS (+)   Anesthesia Other Findings   Reproductive/Obstetrics negative OB ROS                              Anesthesia Physical Anesthesia Plan  ASA: 2  Anesthesia Plan: General   Post-op Pain Management: Regional block*, Tylenol  PO (pre-op)* and Toradol  IV (intra-op)*   Induction: Intravenous  PONV Risk Score and Plan: 3 and Ondansetron , Dexamethasone  and Midazolam   Airway Management Planned: Oral ETT and Video Laryngoscope Planned  Additional Equipment:   Intra-op Plan:   Post-operative Plan: Extubation in OR  Informed Consent: I have reviewed the patients History and Physical, chart, labs and discussed the procedure including the risks, benefits and alternatives for the proposed anesthesia with the patient or authorized representative who has indicated his/her understanding and acceptance.     Dental advisory given  Plan Discussed with: CRNA  Anesthesia Plan Comments:          Anesthesia Quick Evaluation

## 2024-04-13 ENCOUNTER — Other Ambulatory Visit: Payer: Self-pay

## 2024-04-13 ENCOUNTER — Encounter (HOSPITAL_COMMUNITY)
Admission: RE | Disposition: A | Discharge status: Home / Self Care | Payer: Self-pay | Source: Home / Self Care | Attending: General Surgery

## 2024-04-13 ENCOUNTER — Encounter (HOSPITAL_COMMUNITY): Payer: Self-pay | Admitting: General Surgery

## 2024-04-13 ENCOUNTER — Observation Stay (HOSPITAL_COMMUNITY)
Admission: RE | Admit: 2024-04-13 | Discharge: 2024-04-14 | Disposition: A | Discharge status: Home / Self Care | Attending: General Surgery | Admitting: General Surgery

## 2024-04-13 ENCOUNTER — Ambulatory Visit (HOSPITAL_COMMUNITY): Payer: Self-pay | Admitting: Anesthesiology

## 2024-04-13 ENCOUNTER — Ambulatory Visit (HOSPITAL_COMMUNITY): Payer: Self-pay | Admitting: Medical

## 2024-04-13 DIAGNOSIS — E66813 Obesity, class 3: Secondary | ICD-10-CM | POA: Insufficient documentation

## 2024-04-13 DIAGNOSIS — Z01818 Encounter for other preprocedural examination: Secondary | ICD-10-CM

## 2024-04-13 DIAGNOSIS — K4031 Unilateral inguinal hernia, with obstruction, without gangrene, recurrent: Secondary | ICD-10-CM | POA: Diagnosis not present

## 2024-04-13 DIAGNOSIS — Z6841 Body Mass Index (BMI) 40.0 and over, adult: Secondary | ICD-10-CM | POA: Diagnosis not present

## 2024-04-13 DIAGNOSIS — K4091 Unilateral inguinal hernia, without obstruction or gangrene, recurrent: Secondary | ICD-10-CM | POA: Diagnosis present

## 2024-04-13 HISTORY — PX: INGUINAL HERNIA REPAIR: SHX194

## 2024-04-13 LAB — CBC
HCT: 44.6 % (ref 39.0–52.0)
HCT: 43.4 % (ref 39.0–52.0)
Hemoglobin: 14.3 g/dL (ref 13.0–17.0)
Hemoglobin: 14 g/dL (ref 13.0–17.0)
MCH: 28.8 pg (ref 26.0–34.0)
MCH: 29.2 pg (ref 26.0–34.0)
MCHC: 32.1 g/dL (ref 30.0–36.0)
MCHC: 32.3 g/dL (ref 30.0–36.0)
MCV: 89.7 fL (ref 80.0–100.0)
MCV: 90.6 fL (ref 80.0–100.0)
Platelets: 200 K/uL (ref 150–400)
Platelets: 199 K/uL (ref 150–400)
RBC: 4.97 MIL/uL (ref 4.22–5.81)
RBC: 4.79 MIL/uL (ref 4.22–5.81)
RDW: 15 % (ref 11.5–15.5)
RDW: 15.1 % (ref 11.5–15.5)
WBC: 7.7 K/uL (ref 4.0–10.5)
WBC: 15 K/uL — ABNORMAL HIGH (ref 4.0–10.5)
nRBC: 0 % (ref 0.0–0.2)
nRBC: 0 % (ref 0.0–0.2)

## 2024-04-13 LAB — CREATININE, SERUM
Creatinine, Ser: 1.06 mg/dL (ref 0.61–1.24)
GFR, Estimated: >60 mL/min (ref >60)

## 2024-04-13 SURGERY — REPAIR, HERNIA, INGUINAL, ADULT
Anesthesia: General | Site: Groin | Laterality: Right

## 2024-04-13 MED ORDER — KETOROLAC TROMETHAMINE 30 MG/ML IJ SOLN: 30.0000 mg | Freq: Four times a day (QID) | INTRAMUSCULAR | Status: DC | PRN

## 2024-04-13 MED ORDER — PROPOFOL 10 MG/ML IV BOLUS
INTRAVENOUS | Status: DC | PRN
  Administered 2024-04-13: 200 mg via INTRAVENOUS

## 2024-04-13 MED ORDER — ONDANSETRON HCL 4 MG/2ML IJ SOLN: 4.0000 mg | Freq: Four times a day (QID) | INTRAMUSCULAR | Status: DC | PRN

## 2024-04-13 MED ORDER — ONDANSETRON HCL 4 MG/2ML IJ SOLN
INTRAMUSCULAR | Status: DC | PRN
Start: 2024-04-13 — End: 2024-04-13
  Administered 2024-04-13: 4 mg via INTRAVENOUS

## 2024-04-13 MED ORDER — SUCCINYLCHOLINE CHLORIDE 200 MG/10ML IV SOSY
PREFILLED_SYRINGE | INTRAVENOUS | Status: DC | PRN
  Administered 2024-04-13: 100 mg via INTRAVENOUS

## 2024-04-13 MED ORDER — CHLORHEXIDINE GLUCONATE CLOTH 2 % EX PADS: 6.0000 | MEDICATED_PAD | Freq: Once | CUTANEOUS | Status: DC

## 2024-04-13 MED ORDER — 0.9 % SODIUM CHLORIDE (POUR BTL) OPTIME
TOPICAL | Status: DC | PRN
  Administered 2024-04-13: 1000 mL

## 2024-04-13 MED ORDER — BUPIVACAINE LIPOSOME 1.3 % IJ SUSP
INTRAMUSCULAR | Status: DC | PRN
  Administered 2024-04-13: 10 mL

## 2024-04-13 MED ORDER — BUPIVACAINE HCL (PF) 0.5 % IJ SOLN
INTRAMUSCULAR | Status: AC
Start: 2024-04-13 — End: 2024-04-13
  Filled 2024-04-13: qty 30

## 2024-04-13 MED ORDER — SUCCINYLCHOLINE CHLORIDE 200 MG/10ML IV SOSY
PREFILLED_SYRINGE | INTRAVENOUS | Status: AC
Start: 2024-04-13 — End: 2024-04-13
  Filled 2024-04-13: qty 10

## 2024-04-13 MED ORDER — SUGAMMADEX SODIUM 200 MG/2ML IV SOLN
INTRAVENOUS | Status: AC
  Filled 2024-04-13: qty 4

## 2024-04-13 MED ORDER — BUPIVACAINE-EPINEPHRINE (PF) 0.5% -1:200000 IJ SOLN
INTRAMUSCULAR | Status: DC | PRN
  Administered 2024-04-13: 20 mL

## 2024-04-13 MED ORDER — SENNOSIDES-DOCUSATE SODIUM 8.6-50 MG PO TABS: 1.0000 | ORAL_TABLET | Freq: Every evening | ORAL | Status: DC | PRN

## 2024-04-13 MED ORDER — MIDAZOLAM HCL 2 MG/2ML IJ SOLN
INTRAMUSCULAR | Status: DC | PRN
Start: 2024-04-13 — End: 2024-04-13
  Administered 2024-04-13: 2 mg via INTRAVENOUS

## 2024-04-13 MED ORDER — CHLORHEXIDINE GLUCONATE 0.12 % MT SOLN: 15.0000 mL | Freq: Once | OROMUCOSAL | Status: DC

## 2024-04-13 MED ORDER — ONDANSETRON HCL 4 MG/2ML IJ SOLN
INTRAMUSCULAR | Status: AC
  Filled 2024-04-13: qty 2

## 2024-04-13 MED ORDER — FENTANYL CITRATE (PF) 250 MCG/5ML IJ SOLN
INTRAMUSCULAR | Status: DC | PRN
  Administered 2024-04-13: 50 ug via INTRAVENOUS
  Administered 2024-04-13: 75 ug via INTRAVENOUS
  Administered 2024-04-13: 25 ug via INTRAVENOUS
  Administered 2024-04-13: 100 ug via INTRAVENOUS

## 2024-04-13 MED ORDER — LIDOCAINE HCL (PF) 2 % IJ SOLN
INTRAMUSCULAR | Status: DC | PRN
  Administered 2024-04-13: 40 mg via INTRADERMAL

## 2024-04-13 MED ORDER — OXYCODONE HCL 5 MG PO TABS: 5.0000 mg | ORAL_TABLET | ORAL | Status: DC | PRN

## 2024-04-13 MED ORDER — ENSURE PRE-SURGERY PO LIQD: 296.0000 mL | Freq: Once | ORAL | Status: DC

## 2024-04-13 MED ORDER — SUGAMMADEX SODIUM 200 MG/2ML IV SOLN
INTRAVENOUS | Status: DC | PRN
Start: 2024-04-13 — End: 2024-04-13
  Administered 2024-04-13: 400 mg via INTRAVENOUS

## 2024-04-13 MED ORDER — ENOXAPARIN SODIUM 40 MG/0.4ML IJ SOSY
40.0000 mg | PREFILLED_SYRINGE | INTRAMUSCULAR | Status: DC
  Administered 2024-04-14: 40 mg via SUBCUTANEOUS
  Filled 2024-04-13: qty 0.4

## 2024-04-13 MED ORDER — ROCURONIUM BROMIDE 10 MG/ML (PF) SYRINGE
PREFILLED_SYRINGE | INTRAVENOUS | Status: DC | PRN
Start: 2024-04-13 — End: 2024-04-13
  Administered 2024-04-13: 50 mg via INTRAVENOUS
  Administered 2024-04-13: 20 mg via INTRAVENOUS

## 2024-04-13 MED ORDER — PHENYLEPHRINE 80 MCG/ML (10ML) SYRINGE FOR IV PUSH (FOR BLOOD PRESSURE SUPPORT)
PREFILLED_SYRINGE | INTRAVENOUS | Status: AC
  Filled 2024-04-13: qty 10

## 2024-04-13 MED ORDER — HYDROMORPHONE HCL 1 MG/ML IJ SOLN
0.2500 mg | INTRAMUSCULAR | Status: DC | PRN
  Administered 2024-04-13: 0.5 mg via INTRAVENOUS
  Administered 2024-04-13: 0.25 mg via INTRAVENOUS

## 2024-04-13 MED ORDER — BUPIVACAINE HCL 0.5 % IJ SOLN
INTRAMUSCULAR | Status: DC | PRN
  Administered 2024-04-13: 20 mL

## 2024-04-13 MED ORDER — ACETAMINOPHEN 500 MG PO TABS
1000.0000 mg | ORAL_TABLET | Freq: Four times a day (QID) | ORAL | Status: DC
  Administered 2024-04-13 – 2024-04-14 (×3): 1000 mg via ORAL
  Filled 2024-04-13 (×2): qty 2

## 2024-04-13 MED ORDER — ORAL CARE MOUTH RINSE: 15.0000 mL | Freq: Once | OROMUCOSAL | Status: DC

## 2024-04-13 MED ORDER — SIMETHICONE 80 MG PO CHEW: 40.0000 mg | CHEWABLE_TABLET | Freq: Four times a day (QID) | ORAL | Status: DC | PRN

## 2024-04-13 MED ORDER — METOPROLOL TARTRATE 5 MG/5ML IV SOLN: 5.0000 mg | Freq: Four times a day (QID) | INTRAVENOUS | Status: DC | PRN

## 2024-04-13 MED ORDER — DEXAMETHASONE SODIUM PHOSPHATE 10 MG/ML IJ SOLN
INTRAMUSCULAR | Status: AC
  Filled 2024-04-13: qty 1

## 2024-04-13 MED ORDER — PROPOFOL 10 MG/ML IV BOLUS
INTRAVENOUS | Status: AC
  Filled 2024-04-13: qty 20

## 2024-04-13 MED ORDER — PHENYLEPHRINE 80 MCG/ML (10ML) SYRINGE FOR IV PUSH (FOR BLOOD PRESSURE SUPPORT)
PREFILLED_SYRINGE | INTRAVENOUS | Status: DC | PRN
  Administered 2024-04-13: 200 ug via INTRAVENOUS
  Administered 2024-04-13 (×3): 80 ug via INTRAVENOUS
  Administered 2024-04-13: 120 ug via INTRAVENOUS
  Administered 2024-04-13: 80 ug via INTRAVENOUS

## 2024-04-13 MED ORDER — CELECOXIB 200 MG PO CAPS
400.0000 mg | ORAL_CAPSULE | ORAL | Status: AC
  Administered 2024-04-13: 400 mg via ORAL
  Filled 2024-04-13: qty 2

## 2024-04-13 MED ORDER — MIDAZOLAM HCL 2 MG/2ML IJ SOLN
INTRAMUSCULAR | Status: AC
  Filled 2024-04-13: qty 2

## 2024-04-13 MED ORDER — LACTATED RINGERS IV SOLN: INTRAVENOUS | Status: DC

## 2024-04-13 MED ORDER — TRAMADOL HCL 50 MG PO TABS: 50.0000 mg | ORAL_TABLET | Freq: Four times a day (QID) | ORAL | Status: DC | PRN

## 2024-04-13 MED ORDER — ONDANSETRON 4 MG PO TBDP: 4.0000 mg | ORAL_TABLET | Freq: Four times a day (QID) | ORAL | Status: DC | PRN

## 2024-04-13 MED ORDER — HYDROMORPHONE HCL 1 MG/ML IJ SOLN: 0.5000 mg | INTRAMUSCULAR | Status: DC | PRN

## 2024-04-13 MED ORDER — PHENYLEPHRINE 80 MCG/ML (10ML) SYRINGE FOR IV PUSH (FOR BLOOD PRESSURE SUPPORT)
PREFILLED_SYRINGE | INTRAVENOUS | Status: AC
Start: 2024-04-13 — End: 2024-04-13
  Filled 2024-04-13: qty 10

## 2024-04-13 MED ORDER — ACETAMINOPHEN 500 MG PO TABS
1000.0000 mg | ORAL_TABLET | ORAL | Status: AC
  Administered 2024-04-13: 1000 mg via ORAL
  Filled 2024-04-13: qty 2

## 2024-04-13 MED ORDER — FENTANYL CITRATE (PF) 250 MCG/5ML IJ SOLN
INTRAMUSCULAR | Status: AC
  Filled 2024-04-13: qty 5

## 2024-04-13 MED ORDER — PROPOFOL 10 MG/ML IV BOLUS
INTRAVENOUS | Status: AC
Start: 2024-04-13 — End: 2024-04-13
  Filled 2024-04-13: qty 20

## 2024-04-13 MED ORDER — DEXTROSE-SODIUM CHLORIDE 5-0.45 % IV SOLN: INTRAVENOUS | Status: DC

## 2024-04-13 MED ORDER — DEXAMETHASONE SODIUM PHOSPHATE 10 MG/ML IJ SOLN
INTRAMUSCULAR | Status: DC | PRN
Start: 2024-04-13 — End: 2024-04-13
  Administered 2024-04-13: 10 mg via INTRAVENOUS

## 2024-04-13 MED ORDER — CEFAZOLIN SODIUM-DEXTROSE 2-4 GM/100ML-% IV SOLN
2.0000 g | INTRAVENOUS | Status: AC
  Administered 2024-04-13: 2 g via INTRAVENOUS
  Filled 2024-04-13: qty 100

## 2024-04-13 MED ORDER — HYDROMORPHONE HCL 1 MG/ML IJ SOLN
INTRAMUSCULAR | Status: AC
  Filled 2024-04-13: qty 1

## 2024-04-13 MED ORDER — ROCURONIUM BROMIDE 10 MG/ML (PF) SYRINGE
PREFILLED_SYRINGE | INTRAVENOUS | Status: AC
  Filled 2024-04-13: qty 10

## 2024-04-13 MED ORDER — ENSURE SURGERY PO LIQD
237.0000 mL | Freq: Two times a day (BID) | ORAL | Status: DC
  Administered 2024-04-14: 237 mL via ORAL

## 2024-04-13 SURGICAL SUPPLY — 38 items
BAG COUNTER SPONGE SURGICOUNT (BAG) ×1 IMPLANT
BENZOIN TINCTURE PRP APPL 2/3 (GAUZE/BANDAGES/DRESSINGS) IMPLANT
BLADE SURG 15 STRL LF DISP TIS (BLADE) ×1 IMPLANT
CHLORAPREP W/TINT 26 (MISCELLANEOUS) ×1 IMPLANT
COVER SURGICAL LIGHT HANDLE (MISCELLANEOUS) ×1 IMPLANT
DERMABOND ADVANCED .7 DNX12 (GAUZE/BANDAGES/DRESSINGS) ×2 IMPLANT
DRAIN PENROSE 0.5X18 (DRAIN) IMPLANT
DRAPE LAPAROTOMY TRNSV 102X78 (DRAPES) ×2 IMPLANT
DRAPE UTILITY XL STRL (DRAPES) ×1 IMPLANT
DRSG TELFA PLUS 4X6 ADH ISLAND (GAUZE/BANDAGES/DRESSINGS) IMPLANT
ELECT PENCIL ROCKER SW 15FT (MISCELLANEOUS) IMPLANT
ELECT REM PT RETURN 15FT ADLT (MISCELLANEOUS) ×2 IMPLANT
GAUZE SPONGE 4X4 12PLY STRL (GAUZE/BANDAGES/DRESSINGS) IMPLANT
GLOVE BIOGEL PI IND STRL 7.0 (GLOVE) IMPLANT
GLOVE SURG SS PI 7.0 STRL IVOR (GLOVE) ×1 IMPLANT
GOWN STRL REUS W/ TWL XL LVL3 (GOWN DISPOSABLE) ×1 IMPLANT
KIT BASIN OR (CUSTOM PROCEDURE TRAY) ×1 IMPLANT
KIT TURNOVER KIT A (KITS) ×1 IMPLANT
MARKER SKIN DUAL TIP RULER LAB (MISCELLANEOUS) ×1 IMPLANT
MESH HERNIA 3X6 (Mesh General) IMPLANT
NDL HYPO 22X1.5 SAFETY MO (MISCELLANEOUS) ×2 IMPLANT
NEEDLE HYPO 22X1.5 SAFETY MO (MISCELLANEOUS) ×1 IMPLANT
PACK BASIC VI WITH GOWN DISP (CUSTOM PROCEDURE TRAY) ×2 IMPLANT
SPIKE FLUID TRANSFER (MISCELLANEOUS) ×1 IMPLANT
SPONGE T-LAP 18X18 ~~LOC~~+RFID (SPONGE) IMPLANT
SPONGE T-LAP 4X18 ~~LOC~~+RFID (SPONGE) ×1 IMPLANT
STRIP CLOSURE SKIN 1/2X4 (GAUZE/BANDAGES/DRESSINGS) IMPLANT
SUT MNCRL AB 4-0 PS2 18 (SUTURE) ×1 IMPLANT
SUT PDS AB 2-0 CT2 27 (SUTURE) ×2 IMPLANT
SUT PROLENE 2 0 CT2 30 (SUTURE) ×2 IMPLANT
SUT VIC AB 2-0 CT1 TAPERPNT 27 (SUTURE) IMPLANT
SUT VIC AB 3-0 SH 18 (SUTURE) IMPLANT
SUT VIC AB 3-0 SH 27XBRD (SUTURE) IMPLANT
SUT VICRYL 3-0 CR8 SH (SUTURE) ×2 IMPLANT
SYR BULB IRRIG 60ML STRL (SYRINGE) ×1 IMPLANT
SYR CONTROL 10ML LL (SYRINGE) ×1 IMPLANT
TOWEL OR 17X26 10 PK STRL BLUE (TOWEL DISPOSABLE) ×2 IMPLANT
YANKAUER SUCT BULB TIP 10FT TU (MISCELLANEOUS) IMPLANT

## 2024-04-13 NOTE — Anesthesia Procedure Notes (Addendum)
 Procedure Name: Intubation Date/Time: 04/13/2024 7:39 AM  Performed by: Augusta Daved SAILOR, CRNAPre-anesthesia Checklist: Patient identified, Emergency Drugs available, Suction available and Patient being monitored Patient Re-evaluated:Patient Re-evaluated prior to induction Oxygen Delivery Method: Circle System Utilized Preoxygenation: Pre-oxygenation with 100% oxygen Induction Type: IV induction Laryngoscope Size: Glidescope and 3 Grade View: Grade I Tube type: Oral Tube size: 7.5 mm Number of attempts: 1 Airway Equipment and Method: Stylet and Oral airway Placement Confirmation: ETT inserted through vocal cords under direct vision, positive ETCO2 and breath sounds checked- equal and bilateral Secured at: 22 (at the teeth) cm Tube secured with: Tape Dental Injury: Teeth and Oropharynx as per pre-operative assessment  Comments: Lower loose tooth remains intact.

## 2024-04-13 NOTE — Anesthesia Procedure Notes (Signed)
 Anesthesia Regional Block: TAP block   Pre-Anesthetic Checklist: , timeout performed,  Correct Patient, Correct Site, Correct Laterality,  Correct Procedure, Correct Position, site marked,  Risks and benefits discussed,  Pre-op evaluation,  At surgeon's request and post-op pain management  Laterality: Right  Prep: Maximum Sterile Barrier Precautions used, chloraprep       Needles:  Injection technique: Single-shot  Needle Type: Echogenic Stimulator Needle     Needle Length: 9cm  Needle Gauge: 21     Additional Needles:   Procedures:,,,, ultrasound used (permanent image in chart),,    Narrative:  Start time: 04/13/2024 7:35 AM End time: 04/13/2024 7:45 AM Injection made incrementally with aspirations every 5 mL.  Performed by: Personally  Anesthesiologist: Epifanio Fallow, MD

## 2024-04-13 NOTE — Op Note (Signed)
 Preop diagnosis: right incarcerated recurrent inguinal hernia  Postop diagnosis: right incarcerated recurrent inguinal hernia  Procedure: open Right incarcerated recurrent inguinal hernia repair with mesh  Surgeon: Herlene Bureau, M.D.  Asst: none  Anesthesia: Gen.   Indications for procedure: Travis Gould is a 53 y.o. male with symptoms of pain and enlarging Right inguinal hernia(s). After discussing risks, alternatives and benefits he decided on open repair and was brought to day surgery for repair.  Description of procedure: The patient was brought into the operative suite, placed supine. Anesthesia was administered with endotracheal tube. Patient was strapped in place. The patient was prepped and draped in the usual sterile fashion.  The anterior superior iliac spine and pubic tubercle were identified on the Right side. An incision was made 1cm above the connecting line, representative of the location of the inguinal ligament. The subcutaneous tissue was bluntly dissected, scarpa's fascia was dissected away. The external abdominal oblique fascia was identified and sharply opened down to the external inguinal ring. The conjoint tendon and inguinal ligament were identified. The cord structures and sac were dissected free of the surrounding tissue in 360 degrees. A penrose drain was used to encircle the contents. The cremasteric fibers were dissected free of the contents of the cord and hernia sac. The cord structures (vessels and vas deferens) were identified and carefully dissected away from the hernia sac. The hernia sac was scarred into previous repair. Multiple prolenes were seen. The hernia sac was opened and a large amount of small intestine was reduced from the sac and pushed back into the abdominal cavity. The hernia sac was dissected down to the internal inguinal ring. Preperitoneal fat was identified showing appropriate dissection. The sac was then reduced into the preperitoneal space.  There appeared to be a mixed/pantaloon. Additional dissection was done to separate the direct sac from the cord structures and repair scar. The hernia sac was divided with some adherent to the testicle and cord structures. The peritoneum was closed with running 2-0 vicryl and sac placed into the abdomen. The canal floor was large and closed with interrupted 2-0 PDS apposing the conjoint tendon to the inguinal ligament medially. A 3x6 Bard mesh was then used to close the defect and reinforce the floor. The mesh was sutured to the lacunar ligament and inguinal ligament using a 2-0 prolene in running fashion. Next the superior edge of the mesh was sutured to the conjoined tendon using a 2-0 running Prolene. An additional 2-0 Prolene was used to suture the tail ends of the mesh together re-creating the deep ring. Cord structures are running in a neutral position through the mesh. Next the external abdominal oblique fascia was closed with a 2-0 Vicryl in interrupted fashion to re-create the external inguinal ring. Scarpa's fascia was closed with 3-0 Vicryl in running fashion. Skin was closed with a 4-0 Monocryl subcuticular stitch in running fashion. Dermabond place for dressing. Patient woke from anesthesia and brought to PACU in stable condition. All counts are correct.  Findings: very large incarcerated right recurrent indirect/direct inguinal hernia  Specimen: none  Blood loss: 30 ml  Local anesthesia: 20 ml Marcaine   Complications: none  Implant: 3 x 6 in Bard mesh  Herlene Bureau, M.D. General, Bariatric, & Minimally Invasive Surgery [facility], [place] 9:49 AM 04/13/2024

## 2024-04-13 NOTE — Anesthesia Postprocedure Evaluation (Signed)
 Anesthesia Post Note  Patient: Travis Gould  Procedure(s) Performed: OPEN REPAIR OF INCARCERATED INGUINAL HERNIA WITH MESH (Right: Groin)     Patient location during evaluation: PACU Anesthesia Type: General and Regional Level of consciousness: awake and alert Pain management: pain level controlled Vital Signs Assessment: post-procedure vital signs reviewed and stable Respiratory status: spontaneous breathing, nonlabored ventilation and respiratory function stable Cardiovascular status: blood pressure returned to baseline and stable Postop Assessment: no apparent nausea or vomiting Anesthetic complications: no   No notable events documented.  Last Vitals:  Vitals:   04/13/24 1045 04/13/24 1100  BP: (!) 132/93 133/88  Pulse: 85 78  Resp: 12 12  Temp:    SpO2: 99% 96%    Last Pain:  Vitals:   04/13/24 1100  TempSrc:   PainSc: 4                  Fanny Agan,W. EDMOND

## 2024-04-13 NOTE — H&P (Signed)
  Chief Complaint  Patient presents with  Inguinal Hernia   Subjective   Travis Gould is a 53 y.o. male new patient in today for: History of Present Illness Travis Gould is a 52 year old male with recurrent hernia who presents with a large hernia recurrence.  He has a history of recurrent hernia with previous surgical repair, including bowel resection. The hernia causes intermittent pain, especially with movement. He experiences no issues with eating, constipation, or urinary symptoms. He is not on any medications.  He is concerned about the apparent reduction in penis size due to scrotal enlargement and questions if this will normalize post-surgery.  He has no history of smoking or diabetes.    No data to display      No outpatient medications prior to visit.   No facility-administered medications prior to visit.    Objective   Vitals:  02/17/24 0951  BP: 122/76  Pulse: 94  Temp: 36.8 C (98.2 F)  SpO2: 98%  Weight: (!) 121 kg (266 lb 12.8 oz)  Height: 172.7 cm (5' 8)  PainSc: 0-No pain   Body mass index is 40.57 kg/m. Physical Exam Constitutional:  Appearance: Normal appearance.  HENT:  Head: Normocephalic and atraumatic.  Pulmonary:  Effort: Pulmonary effort is normal.  Abdominal:  Comments: Midline scar, very large right inguinal hernia down into very large scrotum, unable to reduce   Musculoskeletal:  General: Normal range of motion.  Cervical back: Normal range of motion.   Neurological:  General: No focal deficit present.  Mental Status: He is alert and oriented to person, place, and time. Mental status is at baseline.   Psychiatric:  Mood and Affect: Mood normal.  Behavior: Behavior normal.  Thought Content: Thought content normal.     I reviewed previous OR notes by myself and Dr. Kimble. He had a strangulated recurrent hernia leading to bowel resection, damage control and later closure. I reviewed notes by Norman Larve.  Assessment/Plan:   Assessment & Plan Recurrent Hernia Recurrent large hernia causing pain and movement issues. Previous repairs complicated, necessitating surgical intervention. Discussed surgical approach, risks, and potential outcomes, including testicle removal and recurrence rate. - Order CT scan to assess hernia and surrounding structures. - Possible Consult urologist regarding potential testicle removal depending on CT scan results. - Schedule surgical repair with mesh placement. We discussed etiology of hernias and how they can cause pain. We discussed options for inguinal hernia repair vs observation. We discussed details of the surgery of general anesthesia, surgical approach and incisions, dissecting the sack away from vas deference, testicular vessels and nerves and placement of mesh. We discussed risks of bleeding, infection, recurrence, injury to vas deference, testicular vessels, nerve injury, and chronic pain. He showed good understanding and wanted proceed with open right inguinal hernia repair as observation.   - Perform surgery under general anesthesia with overnight hospital stay. - Monitor for post-operative complications such as fluid collection and numbness. - Discuss potential need for post-operative drainage.  Diagnoses and all orders for this visit:  Unilateral recurrent inguinal hernia without obstruction or gangrene - CT abdomen pelvis with contrast; Future  Morbid (severe) obesity due to excess calories (CMS/HHS-HCC)

## 2024-04-13 NOTE — Transfer of Care (Signed)
 Immediate Anesthesia Transfer of Care Note  Patient: Travis Gould  Procedure(s) Performed: OPEN REPAIR OF INCARCERATED INGUINAL HERNIA WITH MESH (Right: Groin)  Patient Location: PACU  Anesthesia Type:General  Level of Consciousness: drowsy  Airway & Oxygen Therapy: Patient Spontanous Breathing and Patient connected to face mask oxygen  Post-op Assessment: Report given to RN and Post -op Vital signs reviewed and stable  Post vital signs: Reviewed and stable  Last Vitals:  Vitals Value Taken Time  BP 128/85 04/13/24 09:52  Temp 97.41F Ax 04/13/24 09:56  Pulse 80 04/13/24 09:55  Resp 14 04/13/24 09:55  SpO2 92 % 04/13/24 09:55  Vitals shown include unfiled device data.  Last Pain:  Vitals:   04/13/24 0659  TempSrc: Oral  PainSc: 0-No pain         Complications: No notable events documented.

## 2024-04-14 ENCOUNTER — Encounter (HOSPITAL_COMMUNITY): Payer: Self-pay | Admitting: General Surgery

## 2024-04-14 DIAGNOSIS — K4031 Unilateral inguinal hernia, with obstruction, without gangrene, recurrent: Secondary | ICD-10-CM | POA: Diagnosis not present

## 2024-04-14 LAB — BASIC METABOLIC PANEL WITH GFR
Anion gap: 10 (ref 5–15)
BUN: 12 mg/dL (ref 6–20)
CO2: 23 mmol/L (ref 22–32)
Calcium: 8.8 mg/dL — ABNORMAL LOW (ref 8.9–10.3)
Chloride: 106 mmol/L (ref 98–111)
Creatinine, Ser: 0.87 mg/dL (ref 0.61–1.24)
GFR, Estimated: >60 mL/min (ref >60)
Glucose, Bld: 139 mg/dL — ABNORMAL HIGH (ref 70–99)
Potassium: 4 mmol/L (ref 3.5–5.1)
Sodium: 140 mmol/L (ref 135–145)

## 2024-04-14 LAB — CBC
HCT: 43.5 % (ref 39.0–52.0)
Hemoglobin: 13.7 g/dL (ref 13.0–17.0)
MCH: 28.3 pg (ref 26.0–34.0)
MCHC: 31.5 g/dL (ref 30.0–36.0)
MCV: 89.9 fL (ref 80.0–100.0)
Platelets: 200 K/uL (ref 150–400)
RBC: 4.84 MIL/uL (ref 4.22–5.81)
RDW: 15.3 % (ref 11.5–15.5)
WBC: 13 K/uL — ABNORMAL HIGH (ref 4.0–10.5)
nRBC: 0 % (ref 0.0–0.2)

## 2024-04-14 MED ORDER — OXYCODONE HCL 5 MG PO TABS: 5.0000 mg | ORAL_TABLET | Freq: Four times a day (QID) | ORAL | 0 refills | Status: AC | PRN

## 2024-04-14 MED ORDER — IBUPROFEN 800 MG PO TABS: 800.0000 mg | ORAL_TABLET | Freq: Three times a day (TID) | ORAL | 0 refills | Status: AC | PRN

## 2024-04-14 NOTE — Discharge Summary (Addendum)
 Physician Discharge Summary  Travis Gould FMW:[id number] DOB: [birth year] DOA: 04/13/2024  PCP: Travis Gee, NP  Admit date: 04/13/2024 Discharge date: 04/14/2024   Recommendations for Outpatient Follow-up:   (include homehealth, outpatient follow-up instructions, specific recommendations for PCP to follow-up on, etc.)   Follow-up Information     [name], Travis Righter, MD Follow up on 05/04/2024.   Specialty: General Surgery Contact information: [address] [place] [address] [phone]                Discharge Diagnoses:  Principal Problem:   Recurrent right inguinal hernia   Surgical Procedure: open right recurrent incarcerated inguinal hernia repair with mesh  Discharge Condition: Good Disposition: Home  Diet recommendation: regular diet   Hospital Course:  53 yo male underwent hernia repair. He was observed overnight and discharged home POD 1.  Discharge Instructions  Discharge Instructions     Diet - low sodium heart healthy   Complete by: As directed    Discharge wound care:   Complete by: As directed    Take dressing off today. Ok to shower today. Ice pack 2 times a day for 1 week   Increase activity slowly   Complete by: As directed       Allergies as of 04/14/2024   No Known Allergies      Medication List     TAKE these medications    ibuprofen  800 MG tablet Commonly known as: ADVIL  Take 1 tablet (800 mg total) by mouth every 8 (eight) hours as needed.   oxyCODONE  5 MG immediate release tablet Commonly known as: Oxy IR/ROXICODONE  Take 1 tablet (5 mg total) by mouth every 6 (six) hours as needed for severe pain (pain score 7-10).               Discharge Care Instructions  (From admission, onward)           Start     Ordered   04/14/24 0000  Discharge wound care:       Comments: Take dressing off today. Ok to shower today. Ice pack 2 times a day for 1 week   04/14/24 9071             Follow-up Information     [name], Travis Righter, MD Follow up on 05/04/2024.   Specialty: General Surgery Contact information: [address] [place] [address] [phone]                  The results of significant diagnostics from this hospitalization (including imaging, microbiology, ancillary and laboratory) are listed below for reference.    Significant Diagnostic Studies: No results found.  Labs: Basic Metabolic Panel: Recent Labs  Lab 04/13/24 1316 04/14/24 0422  NA  --  140  K  --  4.0  CL  --  106  CO2  --  23  GLUCOSE  --  139*  BUN  --  12  CREATININE 1.06 0.87  CALCIUM   --  8.8*   Liver Function Tests: No results for input(s): AST, ALT, ALKPHOS, BILITOT, PROT, ALBUMIN  in the last 168 hours.  CBC: Recent Labs  Lab 04/13/24 0723 04/13/24 1316 04/14/24 0422  WBC 7.7 15.0* 13.0*  HGB 14.3 14.0 13.7  HCT 44.6 43.4 43.5  MCV 89.7 90.6 89.9  PLT 200 199 200    CBG: No results for input(s): GLUCAP in the last 168 hours.  Principal Problem:   Recurrent right inguinal hernia  Time coordinating discharge: 15 min

## 2024-04-14 NOTE — Discharge Instructions (Signed)

## 2024-04-14 NOTE — Progress Notes (Signed)
   04/14/24 0830  TOC Brief Assessment  Insurance and Status Reviewed  Patient has primary care physician Yes  Home environment has been reviewed home with s/o  Prior level of function: independent  Prior/Current Home Services No current home services  Social Drivers of Health Review SDOH reviewed no interventions necessary  Readmission risk has been reviewed Yes  Transition of care needs no transition of care needs at this time

## 2024-04-14 NOTE — Progress Notes (Signed)
 Patient was given discharge instructions, and all questions were answered.  Patient was stable for discharge and was taken to the main exit by wheelchair.
